# Patient Record
Sex: Male | Born: 1985 | Race: White | Hispanic: No | Marital: Single | Smoking: Never smoker
Health system: Southern US, Community
[De-identification: ages and names within clinical notes are randomized; demographics above are authoritative.]

## PROBLEM LIST (undated history)

## (undated) DIAGNOSIS — K219 Gastro-esophageal reflux disease without esophagitis: Secondary | ICD-10-CM

## (undated) HISTORY — PX: SURGERY SCROTAL / TESTICULAR: SUR1316

## (undated) HISTORY — DX: Gastro-esophageal reflux disease without esophagitis: K21.9

## (undated) HISTORY — PX: CYSTECTOMY: SUR359

---

## 2010-06-08 ENCOUNTER — Emergency Department (HOSPITAL_COMMUNITY): Admission: EM | Admit: 2010-06-08 | Discharge: 2010-06-08 | Payer: Self-pay | Admitting: Emergency Medicine

## 2011-11-25 ENCOUNTER — Emergency Department (HOSPITAL_COMMUNITY): Payer: Medicare Other

## 2011-11-25 ENCOUNTER — Emergency Department (HOSPITAL_COMMUNITY)
Admission: EM | Admit: 2011-11-25 | Discharge: 2011-11-25 | Disposition: A | Discharge status: Home / Self Care | Payer: Medicare Other | Attending: Emergency Medicine | Admitting: Emergency Medicine

## 2011-11-25 DIAGNOSIS — R10813 Right lower quadrant abdominal tenderness: Secondary | ICD-10-CM | POA: Insufficient documentation

## 2011-11-25 DIAGNOSIS — R1031 Right lower quadrant pain: Secondary | ICD-10-CM | POA: Insufficient documentation

## 2011-11-25 LAB — DIFFERENTIAL
Basophils Absolute: 0 K/uL (ref 0.0–0.1)
Basophils Relative: 0 % (ref 0–1)
Eosinophils Absolute: 0.1 K/uL (ref 0.0–0.7)
Eosinophils Relative: 1 % (ref 0–5)
Lymphocytes Relative: 13 % (ref 12–46)
Lymphs Abs: 1.3 K/uL (ref 0.7–4.0)
Monocytes Absolute: 0.7 K/uL (ref 0.1–1.0)
Monocytes Relative: 7 % (ref 3–12)
Neutro Abs: 7.9 K/uL — ABNORMAL HIGH (ref 1.7–7.7)
Neutrophils Relative %: 78 % — ABNORMAL HIGH (ref 43–77)

## 2011-11-25 LAB — COMPREHENSIVE METABOLIC PANEL WITH GFR
ALT: 12 U/L (ref 0–53)
AST: 16 U/L (ref 0–37)
Albumin: 4.6 g/dL (ref 3.5–5.2)
Alkaline Phosphatase: 81 U/L (ref 39–117)
BUN: 11 mg/dL (ref 6–23)
CO2: 31 meq/L (ref 19–32)
Calcium: 9.8 mg/dL (ref 8.4–10.5)
Chloride: 104 meq/L (ref 96–112)
Creatinine, Ser: 0.92 mg/dL (ref 0.50–1.35)
GFR calc Af Amer: >90 mL/min
GFR calc non Af Amer: >90 mL/min
Glucose, Bld: 104 mg/dL — ABNORMAL HIGH (ref 70–99)
Potassium: 3.9 meq/L (ref 3.5–5.1)
Sodium: 142 meq/L (ref 135–145)
Total Bilirubin: 1.4 mg/dL — ABNORMAL HIGH (ref 0.3–1.2)
Total Protein: 8.1 g/dL (ref 6.0–8.3)

## 2011-11-25 LAB — URINALYSIS, ROUTINE W REFLEX MICROSCOPIC
Bilirubin Urine: NEGATIVE
Glucose, UA: NEGATIVE mg/dL
Hgb urine dipstick: NEGATIVE
Ketones, ur: NEGATIVE mg/dL
Leukocytes, UA: NEGATIVE
Nitrite: NEGATIVE
Protein, ur: NEGATIVE mg/dL
Specific Gravity, Urine: 1.025 (ref 1.005–1.030)
Urobilinogen, UA: 0.2 mg/dL (ref 0.0–1.0)
pH: 6.5 (ref 5.0–8.0)

## 2011-11-25 LAB — CBC
HCT: 51.5 % (ref 39.0–52.0)
MCHC: 33.6 g/dL (ref 30.0–36.0)
Platelets: 198 10*3/uL (ref 150–400)
RDW: 14.2 % (ref 11.5–15.5)

## 2011-11-25 MED ORDER — ONDANSETRON HCL 4 MG/2ML IJ SOLN
4.0000 mg | Freq: Once | INTRAMUSCULAR | Status: AC
  Administered 2011-11-25: 4 mg via INTRAVENOUS
  Filled 2011-11-25: qty 2

## 2011-11-25 MED ORDER — MORPHINE SULFATE 4 MG/ML IJ SOLN
4.0000 mg | Freq: Once | INTRAMUSCULAR | Status: AC
  Administered 2011-11-25: 4 mg via INTRAVENOUS
  Filled 2011-11-25: qty 1

## 2011-11-25 MED ORDER — IOHEXOL 300 MG/ML  SOLN
100.0000 mL | Freq: Once | INTRAMUSCULAR | Status: AC | PRN
  Administered 2011-11-25: 100 mL via INTRAVENOUS

## 2011-11-25 NOTE — ED Provider Notes (Signed)
History  Scribed for Ward Givens, MD, the patient was seen in [facility]18/[facility]18. The chart was scribed by Gilman Schmidt. The patients care was started at 11:34 AM.   CSN: [id number] Arrival date & time: 11/25/2011 11:08 AM   First MD Initiated Contact with Patient 11/25/11 1112      Chief Complaint  Patient presents with  . Abdominal Pain     HPI Clifford Rowe is a 25 y.o. male who presents to the Emergency Department complaining of non radiating RLQ abdominal pain onset two days. Pt was referred by PCP to ED in order to rule out appendicitis. Denies any n/v, dysuria, urinary frequency, back pain, diarrhea, or fever. There are no exacerbating or alleviating factors. Denies any other medical problems. Pt notes having un descended testicle repair at age 42. Pt has had loss in appetite today. There are no other associated symptoms and no other alleviating or aggravating factors.    History reviewed. No pertinent past medical history. Mentally slow  History reviewed. No pertinent past surgical history. 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 No pertinent family history.  History  Substance Use Topics  . Smoking status: Never Smoker   . Smokeless tobacco: Not on file  . Alcohol Use: No  Unemployed and on SSI    Review of Systems  Gastrointestinal: Positive for abdominal pain. Negative for nausea, vomiting and diarrhea.  Genitourinary: Negative for dysuria and difficulty urinating.  All other systems reviewed and are negative.    Allergies  Review of patient's allergies indicates no known allergies.  Home Medications   Current Outpatient Rx  Name Route Sig Dispense Refill  . OMEPRAZOLE 20 MG PO CPDR Oral Take 20 mg by mouth daily.        BP 138/84  Pulse 75  Temp(Src) 98.2 F (36.8 C) (Oral)  Resp 18  Ht 6\' 2"  (1.88 m)  Wt 171 lb (77.565 kg)  BMI 21.96 kg/m2  SpO2 100%  Vital signs normal  Physical Exam  Constitutional: He is oriented to person, place,  and time. He appears well-developed and well-nourished.  Non-toxic appearance. He does not have a sickly appearance.  HENT:  Head: Normocephalic and atraumatic.  Eyes: Conjunctivae, EOM and lids are normal. Pupils are equal, round, and reactive to light.  Neck: Trachea normal, normal range of motion and full passive range of motion without pain. Neck supple.  Cardiovascular: Regular rhythm and normal heart sounds.   Pulmonary/Chest: Effort normal and breath sounds normal. No respiratory distress.  Abdominal: Soft. Normal appearance. He exhibits no distension. There is tenderness in the right lower quadrant. There is no rebound and no CVA tenderness.       No pain to knee tapping No Psoasis Sign   Musculoskeletal: Normal range of motion.  Neurological: He is alert and oriented to person, place, and time. He has normal strength.  Skin: Skin is warm, dry and intact. No rash noted.    ED Course  Procedures (including critical care time)  DIAGNOSTIC STUDIES: Oxygen Saturation is 100% on room air, normal by my interpretation.    COORDINATION OF CARE: 11:34am:  - Patient evaluated by ED physician, Morphine, Zofran, CT Abdomen, CBC, Diff, CMP, UA ordered 1:54pm: Recheck by EDP. Patient reports improved symptoms. Results Reviewed with patient. Plan for discharge  LABS Results for orders placed during the hospital encounter of 11/25/11  CBC      Component Value Range   WBC 10.1  4.0 - 10.5 (K/uL)  RBC 5.78  4.22 - 5.81 (MIL/uL)   Hemoglobin 17.3 (*) 13.0 - 17.0 (g/dL)   HCT 16.1  09.6 - 04.5 (%)   MCV 89.1  78.0 - 100.0 (fL)   MCH 29.9  26.0 - 34.0 (pg)   MCHC 33.6  30.0 - 36.0 (g/dL)   RDW 40.9  81.1 - 91.4 (%)   Platelets 198  150 - 400 (K/uL)  DIFFERENTIAL      Component Value Range   Neutrophils Relative 78 (*) 43 - 77 (%)   Neutro Abs 7.9 (*) 1.7 - 7.7 (K/uL)   Lymphocytes Relative 13  12 - 46 (%)   Lymphs Abs 1.3  0.7 - 4.0 (K/uL)   Monocytes Relative 7  3 - 12 (%)    Monocytes Absolute 0.7  0.1 - 1.0 (K/uL)   Eosinophils Relative 1  0 - 5 (%)   Eosinophils Absolute 0.1  0.0 - 0.7 (K/uL)   Basophils Relative 0  0 - 1 (%)   Basophils Absolute 0.0  0.0 - 0.1 (K/uL)  COMPREHENSIVE METABOLIC PANEL      Component Value Range   Sodium 142  135 - 145 (mEq/L)   Potassium 3.9  3.5 - 5.1 (mEq/L)   Chloride 104  96 - 112 (mEq/L)   CO2 31  19 - 32 (mEq/L)   Glucose, Bld 104 (*) 70 - 99 (mg/dL)   BUN 11  6 - 23 (mg/dL)   Creatinine, Ser 7.82  0.50 - 1.35 (mg/dL)   Calcium 9.8  8.4 - 95.6 (mg/dL)   Total Protein 8.1  6.0 - 8.3 (g/dL)   Albumin 4.6  3.5 - 5.2 (g/dL)   AST 16  0 - 37 (U/L)   ALT 12  0 - 53 (U/L)   Alkaline Phosphatase 81  39 - 117 (U/L)   Total Bilirubin 1.4 (*) 0.3 - 1.2 (mg/dL)   GFR calc non Af Amer >90  >90 (mL/min)   GFR calc Af Amer >90  >90 (mL/min)  URINALYSIS, ROUTINE W REFLEX MICROSCOPIC      Component Value Range   Color, Urine YELLOW  YELLOW    APPearance CLEAR  CLEAR    Specific Gravity, Urine 1.025  1.005 - 1.030    pH 6.5  5.0 - 8.0    Glucose, UA NEGATIVE  NEGATIVE (mg/dL)   Hgb urine dipstick NEGATIVE  NEGATIVE    Bilirubin Urine NEGATIVE  NEGATIVE    Ketones, ur NEGATIVE  NEGATIVE (mg/dL)   Protein, ur NEGATIVE  NEGATIVE (mg/dL)   Urobilinogen, UA 0.2  0.0 - 1.0 (mg/dL)   Nitrite NEGATIVE  NEGATIVE    Leukocytes, UA NEGATIVE  NEGATIVE    Vital signs no acute changes  Ct Abdomen Pelvis W Contrast  11/25/2011  *RADIOLOGY REPORT*  Clinical Data: Right lower quadrant pain  CT ABDOMEN AND PELVIS WITH CONTRAST  Technique:  Multidetector CT imaging of the abdomen and pelvis was performed following the standard protocol during bolus administration of intravenous contrast.  Contrast: OMNIPAQUE IOHEXOL 300 MG/ML IV SOLN  Comparison: July 29, 2007  Findings: The lung bases are clear.  The osseous structures are unremarkable.  The liver, gallbladder, spleen, pancreas, adrenal glands, kidneys, urinary bladder have a normal  appearance.  The bowel is unremarkable with no evidence of gross inflammation or obstruction.  The appendix is not seen but there are no secondary signs of appendicitis.  There is no adenopathy or free fluid within the abdomen or pelvis.  No  pneumoperitoneum.  IMPRESSION: Negative CT scan of the abdomen pelvis.  Original Report Authenticated By: Brandon Melnick, M.D.   Diagnoses that have been ruled out:  Diagnoses that are still under consideration:  Final diagnoses:  Abdominal pain    Plan discharge  MDM     I personally performed the services described in this documentation, which was scribed in my presence. The recorded information has been reviewed and considered. Devoria Albe, MD, Armando Gang       Ward Givens, MD 11/25/11 [phone]

## 2011-11-25 NOTE — ED Notes (Signed)
Dr. Modesto Charon from [facility] called this am and reports sent pt here for eval of RLQ pain.  Reports he did a dip stick urine in the office and reports was normal.  Reports pt c/o pain starting yesterday, no fever.

## 2011-11-25 NOTE — ED Notes (Signed)
Pt presents with RLQ pain x 2 days. Pt denies all other symptoms in triage. Pt sent from PMD.

## 2011-12-28 DIAGNOSIS — R5383 Other fatigue: Secondary | ICD-10-CM | POA: Diagnosis not present

## 2011-12-28 DIAGNOSIS — E559 Vitamin D deficiency, unspecified: Secondary | ICD-10-CM | POA: Diagnosis not present

## 2011-12-28 DIAGNOSIS — R5381 Other malaise: Secondary | ICD-10-CM | POA: Diagnosis not present

## 2012-01-14 DIAGNOSIS — I861 Scrotal varices: Secondary | ICD-10-CM | POA: Diagnosis not present

## 2012-01-15 DIAGNOSIS — N509 Disorder of male genital organs, unspecified: Secondary | ICD-10-CM | POA: Diagnosis not present

## 2012-01-15 DIAGNOSIS — I861 Scrotal varices: Secondary | ICD-10-CM | POA: Diagnosis not present

## 2012-01-15 DIAGNOSIS — R109 Unspecified abdominal pain: Secondary | ICD-10-CM | POA: Diagnosis not present

## 2012-01-23 DIAGNOSIS — I861 Scrotal varices: Secondary | ICD-10-CM | POA: Insufficient documentation

## 2012-01-23 DIAGNOSIS — Z87438 Personal history of other diseases of male genital organs: Secondary | ICD-10-CM | POA: Insufficient documentation

## 2012-01-23 DIAGNOSIS — Z87898 Personal history of other specified conditions: Secondary | ICD-10-CM | POA: Diagnosis not present

## 2012-01-23 DIAGNOSIS — N509 Disorder of male genital organs, unspecified: Secondary | ICD-10-CM | POA: Diagnosis not present

## 2012-02-19 DIAGNOSIS — I861 Scrotal varices: Secondary | ICD-10-CM | POA: Diagnosis not present

## 2012-02-19 DIAGNOSIS — N508 Other specified disorders of male genital organs: Secondary | ICD-10-CM | POA: Diagnosis not present

## 2012-02-19 DIAGNOSIS — N432 Other hydrocele: Secondary | ICD-10-CM | POA: Diagnosis not present

## 2012-02-19 DIAGNOSIS — K219 Gastro-esophageal reflux disease without esophagitis: Secondary | ICD-10-CM | POA: Diagnosis not present

## 2012-04-26 DIAGNOSIS — J019 Acute sinusitis, unspecified: Secondary | ICD-10-CM | POA: Diagnosis not present

## 2012-05-02 DIAGNOSIS — J3489 Other specified disorders of nose and nasal sinuses: Secondary | ICD-10-CM | POA: Diagnosis not present

## 2012-05-02 DIAGNOSIS — R51 Headache: Secondary | ICD-10-CM | POA: Diagnosis not present

## 2012-05-02 DIAGNOSIS — J320 Chronic maxillary sinusitis: Secondary | ICD-10-CM | POA: Diagnosis not present

## 2012-05-17 DIAGNOSIS — I861 Scrotal varices: Secondary | ICD-10-CM | POA: Diagnosis not present

## 2012-05-17 DIAGNOSIS — N509 Disorder of male genital organs, unspecified: Secondary | ICD-10-CM | POA: Diagnosis not present

## 2012-05-17 DIAGNOSIS — Z87898 Personal history of other specified conditions: Secondary | ICD-10-CM | POA: Diagnosis not present

## 2012-05-28 DIAGNOSIS — J310 Chronic rhinitis: Secondary | ICD-10-CM | POA: Diagnosis not present

## 2012-05-28 DIAGNOSIS — J3489 Other specified disorders of nose and nasal sinuses: Secondary | ICD-10-CM | POA: Diagnosis not present

## 2012-05-28 DIAGNOSIS — J343 Hypertrophy of nasal turbinates: Secondary | ICD-10-CM | POA: Diagnosis not present

## 2012-05-28 DIAGNOSIS — J342 Deviated nasal septum: Secondary | ICD-10-CM | POA: Diagnosis not present

## 2012-06-20 DIAGNOSIS — J309 Allergic rhinitis, unspecified: Secondary | ICD-10-CM | POA: Diagnosis not present

## 2012-06-20 DIAGNOSIS — J342 Deviated nasal septum: Secondary | ICD-10-CM | POA: Diagnosis not present

## 2012-06-20 DIAGNOSIS — J310 Chronic rhinitis: Secondary | ICD-10-CM | POA: Diagnosis not present

## 2012-06-20 DIAGNOSIS — J3489 Other specified disorders of nose and nasal sinuses: Secondary | ICD-10-CM | POA: Diagnosis not present

## 2012-08-31 DIAGNOSIS — J309 Allergic rhinitis, unspecified: Secondary | ICD-10-CM | POA: Diagnosis not present

## 2012-08-31 DIAGNOSIS — R42 Dizziness and giddiness: Secondary | ICD-10-CM | POA: Diagnosis not present

## 2012-09-02 DIAGNOSIS — I10 Essential (primary) hypertension: Secondary | ICD-10-CM | POA: Diagnosis not present

## 2012-09-02 DIAGNOSIS — E785 Hyperlipidemia, unspecified: Secondary | ICD-10-CM | POA: Diagnosis not present

## 2012-09-02 DIAGNOSIS — R5383 Other fatigue: Secondary | ICD-10-CM | POA: Diagnosis not present

## 2012-10-04 ENCOUNTER — Ambulatory Visit (HOSPITAL_COMMUNITY)
Admission: RE | Admit: 2012-10-04 | Discharge: 2012-10-04 | Disposition: A | Discharge status: Home / Self Care | Payer: Medicare Other | Source: Ambulatory Visit | Attending: Family Medicine | Admitting: Family Medicine

## 2012-10-04 ENCOUNTER — Other Ambulatory Visit: Payer: Self-pay | Admitting: Family Medicine

## 2012-10-04 DIAGNOSIS — R109 Unspecified abdominal pain: Secondary | ICD-10-CM | POA: Diagnosis not present

## 2012-10-04 DIAGNOSIS — R1011 Right upper quadrant pain: Secondary | ICD-10-CM

## 2012-10-05 ENCOUNTER — Emergency Department (HOSPITAL_COMMUNITY)
Admission: EM | Admit: 2012-10-05 | Discharge: 2012-10-05 | Disposition: A | Discharge status: Home / Self Care | Payer: Medicare Other | Attending: Emergency Medicine | Admitting: Emergency Medicine

## 2012-10-05 ENCOUNTER — Encounter (HOSPITAL_COMMUNITY): Payer: Self-pay | Admitting: *Deleted

## 2012-10-05 DIAGNOSIS — K219 Gastro-esophageal reflux disease without esophagitis: Secondary | ICD-10-CM | POA: Insufficient documentation

## 2012-10-05 LAB — URINALYSIS, ROUTINE W REFLEX MICROSCOPIC
Glucose, UA: NEGATIVE mg/dL
Leukocytes, UA: NEGATIVE
pH: 7 (ref 5.0–8.0)

## 2012-10-05 LAB — COMPREHENSIVE METABOLIC PANEL
AST: 16 U/L (ref 0–37)
Alkaline Phosphatase: 72 U/L (ref 39–117)
BUN: 9 mg/dL (ref 6–23)
CO2: 29 mEq/L (ref 19–32)
Chloride: 99 mEq/L (ref 96–112)
Creatinine, Ser: 0.98 mg/dL (ref 0.50–1.35)
GFR calc non Af Amer: >90 mL/min (ref >90)
Total Bilirubin: 1.6 mg/dL — ABNORMAL HIGH (ref 0.3–1.2)

## 2012-10-05 LAB — CBC WITH DIFFERENTIAL/PLATELET
Basophils Absolute: 0 10*3/uL (ref 0.0–0.1)
HCT: 51.1 % (ref 39.0–52.0)
Hemoglobin: 17.6 g/dL — ABNORMAL HIGH (ref 13.0–17.0)
Lymphocytes Relative: 13 % (ref 12–46)
Monocytes Absolute: 0.5 10*3/uL (ref 0.1–1.0)
Monocytes Relative: 7 % (ref 3–12)
Neutro Abs: 6.7 10*3/uL (ref 1.7–7.7)
RBC: 5.86 MIL/uL — ABNORMAL HIGH (ref 4.22–5.81)
WBC: 8.4 10*3/uL (ref 4.0–10.5)

## 2012-10-05 MED ORDER — OXYCODONE-ACETAMINOPHEN 5-325 MG PO TABS
2.0000 | ORAL_TABLET | Freq: Once | ORAL | Status: AC
  Administered 2012-10-05: 2 via ORAL
  Filled 2012-10-05: qty 2

## 2012-10-05 MED ORDER — GI COCKTAIL ~~LOC~~
30.0000 mL | Freq: Once | ORAL | Status: AC
  Administered 2012-10-05: 30 mL via ORAL
  Filled 2012-10-05: qty 30

## 2012-10-05 MED ORDER — OXYCODONE-ACETAMINOPHEN 5-325 MG PO TABS: 1.0000 | ORAL_TABLET | Freq: Four times a day (QID) | ORAL | Status: DC | PRN

## 2012-10-05 MED ORDER — PANTOPRAZOLE SODIUM 40 MG PO TBEC: 40.0000 mg | DELAYED_RELEASE_TABLET | Freq: Every day | ORAL | Status: DC

## 2012-10-05 NOTE — ED Provider Notes (Signed)
History  This chart was scribed for Raeleigh Guinn B. Bernette Mayers, MD by Shari Heritage. The patient was seen in room APA11/APA11. Patient's care was started at 1234.     CSN: [id number]  Arrival date & time 10/05/12  1140   First MD Initiated Contact with Patient 10/05/12 1234      Chief Complaint  Patient presents with  . Abdominal Pain  . Nausea     The history is provided by the patient. No language interpreter was used.    Clifford Rowe is a 26 y.o. male who presents to the Emergency Department complaining of moderate to severe, constant, non-radiating, burning epigastric abdominal pain. Patient denies nausea, vomiting, diarrhea, bowel incontinence or any urinary symptoms. Patient rates pain as 8/10. Patient states that pain is worse at night and when he eats. Patient says that the pain does not travel up into his chest and throat. He states that pain is similar to acid reflux pain. He still has some omeprazole at home and states that he took 1 capsule this morning. Patient denies history of pancreatic or gallbladder disorders. He does not smoke.     History reviewed. No pertinent past medical history.  Past Surgical History  Procedure Date  . Cystectomy   . Surgery scrotal / testicular     History reviewed. No pertinent family history.  History  Substance Use Topics  . Smoking status: Never Smoker   . Smokeless tobacco: Not on file  . Alcohol Use: No      Review of Systems A complete 10 system review of systems was obtained and all systems are negative except as noted in the HPI and PMH.   Allergies  Review of patient's allergies indicates no known allergies.  Home Medications   Current Outpatient Rx  Name Route Sig Dispense Refill  . OMEPRAZOLE 20 MG PO CPDR Oral Take 20 mg by mouth daily.        BP 129/76  Pulse 97  Temp 98.2 F (36.8 C) (Oral)  Resp 18  Ht 6\' 2"  (1.88 m)  Wt 175 lb (79.379 kg)  BMI 22.47 kg/m2  SpO2 100%  Physical Exam  Nursing note and  vitals reviewed. Constitutional: He is oriented to person, place, and time. He appears well-developed and well-nourished.  HENT:  Head: Normocephalic and atraumatic.  Eyes: EOM are normal. Pupils are equal, round, and reactive to light.  Neck: Normal range of motion. Neck supple.  Cardiovascular: Normal rate, normal heart sounds and intact distal pulses.   Pulmonary/Chest: Effort normal and breath sounds normal.  Abdominal: Bowel sounds are normal. He exhibits no distension. There is no tenderness.       Nontender.  Musculoskeletal: Normal range of motion. He exhibits no edema and no tenderness.  Neurological: He is alert and oriented to person, place, and time. He has normal strength. No cranial nerve deficit or sensory deficit.  Skin: Skin is warm and dry. No rash noted.  Psychiatric: He has a normal mood and affect.    ED Course  Procedures (including critical care time) DIAGNOSTIC STUDIES: Oxygen Saturation is 100% on room air, normal by my interpretation.    COORDINATION OF CARE: 12:44pm- Patient informed of current plan for treatment and evaluation and agrees with plan at this time.    Labs Reviewed  CBC WITH DIFFERENTIAL - Abnormal; Notable for the following:    RBC 5.86 (*)     Hemoglobin 17.6 (*)     Neutrophils Relative 80 (*)  All other components within normal limits  COMPREHENSIVE METABOLIC PANEL - Abnormal; Notable for the following:    Total Bilirubin 1.6 (*)     All other components within normal limits  URINALYSIS, ROUTINE W REFLEX MICROSCOPIC  LIPASE, BLOOD   US Abdomen Limited Ruq  10/04/2012  *RADIOLOGY REPORT*  Clinical Data:  Right upper quadrant abdominal pain.  LIMITED ABDOMINAL ULTRASOUND - RIGHT UPPER QUADRANT  Comparison:  CT of the abdomen and pelvis 11/25/2011.  Findings:  Gallbladder:  No shadowing gallstones or echogenic sludge.  No gallbladder wall thickening or pericholecystic fluid.  Negative sonographic Murphy's sign according to the  ultrasound technologist.  Common bile duct:  Normal caliber measuring 3.2 mm in the porta hepatis.  Liver:  Normal size and echotexture without focal parenchymal abnormality.  Patent portal vein with hepatopetal flow.  IMPRESSION: Unremarkable right upper quadrant abdominal ultrasound. Specifically, no evidence of cholelithiasis or findings to suggest acute cholecystitis at this time.                    Original Report Authenticated By: Florencia Reasons, M.D.      No diagnosis found.    MDM  After ED workup complete, patient informed me of the [place] he had done yesterday (see above) after visit to PCP for same symptoms. Labs here normal. Symptoms related to GERD not well controlled on omeprazole. Will change to protonix, advised PCP followup for further eval.       I personally performed the services described in the documentation, which were scribed in my presence. The recorded information has been reviewed and considered.     Bob Eastwood B. Bernette Mayers, MD 10/05/12 1351

## 2012-10-05 NOTE — ED Notes (Signed)
Pt with abd pain and nausea x 5 days, denies vomiting or diarrhea

## 2012-11-11 DIAGNOSIS — R7989 Other specified abnormal findings of blood chemistry: Secondary | ICD-10-CM | POA: Diagnosis not present

## 2013-03-15 ENCOUNTER — Ambulatory Visit (INDEPENDENT_AMBULATORY_CARE_PROVIDER_SITE_OTHER): Payer: Medicare Other | Admitting: Family Medicine

## 2013-03-15 VITALS — BP 132/69 | HR 82 | Temp 97.1°F | Ht 75.0 in | Wt 170.1 lb

## 2013-03-15 DIAGNOSIS — J069 Acute upper respiratory infection, unspecified: Secondary | ICD-10-CM | POA: Diagnosis not present

## 2013-03-15 DIAGNOSIS — J019 Acute sinusitis, unspecified: Secondary | ICD-10-CM | POA: Diagnosis not present

## 2013-03-15 DIAGNOSIS — J309 Allergic rhinitis, unspecified: Secondary | ICD-10-CM | POA: Diagnosis not present

## 2013-03-15 MED ORDER — AMOXICILLIN 875 MG PO TABS: 875.0000 mg | ORAL_TABLET | Freq: Two times a day (BID) | ORAL | Status: DC

## 2013-03-15 NOTE — Progress Notes (Signed)
Subjective:     Patient ID: Clifford Rowe, male   DOB: 12-12-1986, 27 y.o.   MRN: [id number]  HPI Patient comes in with a five-day history of a severe upper respiratory infection. He is so congested he has to mouth breathe. He uses Flonase nasal spray. Start to get pain under his eyes specifically in the cheeks and is blowing yellow-green stuff from his nose. No shortness of breath no cough and no chest pain.  Past Medical History  Diagnosis Date  . GERD (gastroesophageal reflux disease)    Past Surgical History  Procedure Laterality Date  . Cystectomy    . Surgery scrotal / testicular     History   Social History  . Marital Status: Single    Spouse Name: N/A    Number of Children: N/A  . Years of Education: N/A   Occupational History  . unemployed    Social History Main Topics  . Smoking status: Never Smoker   . Smokeless tobacco: Not on file  . Alcohol Use: No  . Drug Use: No  . Sexually Active: Not on file   Other Topics Concern  . Not on file   Social History Narrative  . No narrative on file   Family History  Problem Relation Age of Onset  . Cancer Father     lung   Current Outpatient Prescriptions on File Prior to Visit  Medication Sig Dispense Refill  . pantoprazole (PROTONIX) 40 MG tablet Take 1 tablet (40 mg total) by mouth daily.  30 tablet  0  . aspirin EC 81 MG tablet Take 162 mg by mouth daily as needed. For pain      . oxyCODONE-acetaminophen (PERCOCET/ROXICET) 5-325 MG per tablet Take 1-2 tablets by mouth every 6 (six) hours as needed for pain.  20 tablet  0  . [DISCONTINUED] omeprazole (PRILOSEC) 20 MG capsule Take 20 mg by mouth daily.        No current facility-administered medications on file prior to visit.   No Known Allergies  There is no immunization history on file for this patient. Prior to Admission medications   Medication Sig Start Date End Date Taking? Authorizing Provider  pantoprazole (PROTONIX) 40 MG tablet Take 1 tablet (40 mg  total) by mouth daily. 10/05/12  Yes Charles B. Bernette Mayers, MD  amoxicillin (AMOXIL) 875 MG tablet Take 1 tablet (875 mg total) by mouth 2 (two) times daily. 03/15/13   Ileana Ladd, MD  aspirin EC 81 MG tablet Take 162 mg by mouth daily as needed. For pain    Historical Provider, MD  oxyCODONE-acetaminophen (PERCOCET/ROXICET) 5-325 MG per tablet Take 1-2 tablets by mouth every 6 (six) hours as needed for pain. 10/05/12   Charles B. Bernette Mayers, MD      Review of Systems No other specific complaints.    Objective:   Physical Exam On examination he appeared in no acute distress. Very congested nasally and mouth breathing Vital signs as documented.  Skin warm and dry and without overt rashes.  Head &Neck without JVD. Normal. Coryza. Nasal mucosa is swollen boggy and the left nostril is not patent due to the swelling. Maxillary sinus area mildly tender to palpation. Lungs clear.  Heart exam notable for regular rhythm, normal sounds and absence of murmurs, rubs or gallops.  Abdomen unremarkable and without evidence of organomegaly, masses, or abdominal aortic enlargement.  Extremities nonedematous. Neurology normal Assessment:        Acute sinusitis - Plan: amoxicillin (AMOXIL) 875 MG  tablet  Allergic rhinitis  URI (upper respiratory infection)   Plan:     Continue to use his Flonase daily. Increase fluids. Prescribed from an antibiotic as per med orders in Epic return to the clinic when necessary.  Selvin Yun P. Modesto Charon, M.D.

## 2013-04-09 ENCOUNTER — Telehealth: Payer: Self-pay | Admitting: Nurse Practitioner

## 2013-04-09 NOTE — Telephone Encounter (Signed)
Pt aware no available appts and he will call back to reschedule

## 2013-04-19 ENCOUNTER — Other Ambulatory Visit: Payer: Self-pay | Admitting: Physician Assistant

## 2013-04-21 NOTE — Telephone Encounter (Signed)
Okay to refill x3

## 2013-04-21 NOTE — Telephone Encounter (Signed)
Called RX to [facility].

## 2013-04-21 NOTE — Telephone Encounter (Signed)
LAST OV 10/13 

## 2013-05-16 DIAGNOSIS — I861 Scrotal varices: Secondary | ICD-10-CM | POA: Diagnosis not present

## 2013-05-16 DIAGNOSIS — N509 Disorder of male genital organs, unspecified: Secondary | ICD-10-CM | POA: Diagnosis not present

## 2013-05-28 ENCOUNTER — Encounter: Payer: Self-pay | Admitting: Physician Assistant

## 2013-05-28 ENCOUNTER — Ambulatory Visit (INDEPENDENT_AMBULATORY_CARE_PROVIDER_SITE_OTHER): Payer: Medicare Other | Admitting: Physician Assistant

## 2013-05-28 VITALS — BP 125/74 | HR 66 | Temp 97.7°F | Ht 75.0 in | Wt 164.0 lb

## 2013-05-28 DIAGNOSIS — K219 Gastro-esophageal reflux disease without esophagitis: Secondary | ICD-10-CM | POA: Diagnosis not present

## 2013-05-28 DIAGNOSIS — M549 Dorsalgia, unspecified: Secondary | ICD-10-CM

## 2013-05-28 MED ORDER — MELOXICAM 15 MG PO TABS: 15.0000 mg | ORAL_TABLET | Freq: Every day | ORAL | Status: DC

## 2013-05-28 NOTE — Patient Instructions (Signed)
Back Pain, Adult  Low back pain is very common. About 1 in 5 people have back pain. The cause of low back pain is rarely dangerous. The pain often gets better over time. About half of people with a sudden onset of back pain feel better in just 2 weeks. About 8 in 10 people feel better by 6 weeks.   CAUSES  Some common causes of back pain include:  · Strain of the muscles or ligaments supporting the spine.  · Wear and tear (degeneration) of the spinal discs.  · Arthritis.  · Direct injury to the back.  DIAGNOSIS  Most of the time, the direct cause of low back pain is not known. However, back pain can be treated effectively even when the exact cause of the pain is unknown. Answering your caregiver's questions about your overall health and symptoms is one of the most accurate ways to make sure the cause of your pain is not dangerous. If your caregiver needs more information, he or she may order lab work or imaging tests (X-rays or MRIs). However, even if imaging tests show changes in your back, this usually does not require surgery.  HOME CARE INSTRUCTIONS  For many people, back pain returns. Since low back pain is rarely dangerous, it is often a condition that people can learn to manage on their own.   · Remain active. It is stressful on the back to sit or stand in one place. Do not sit, drive, or stand in one place for more than 30 minutes at a time. Take short walks on level surfaces as soon as pain allows. Try to increase the length of time you walk each day.  · Do not stay in bed. Resting more than 1 or 2 days can delay your recovery.  · Do not avoid exercise or work. Your body is made to move. It is not dangerous to be active, even though your back may hurt. Your back will likely heal faster if you return to being active before your pain is gone.  · Pay attention to your body when you  bend and lift. Many people have less discomfort when lifting if they bend their knees, keep the load close to their bodies, and  avoid twisting. Often, the most comfortable positions are those that put less stress on your recovering back.  · Find a comfortable position to sleep. Use a firm mattress and lie on your side with your knees slightly bent. If you lie on your back, put a pillow under your knees.  · Only take over-the-counter or prescription medicines as directed by your caregiver. Over-the-counter medicines to reduce pain and inflammation are often the most helpful. Your caregiver may prescribe muscle relaxant drugs. These medicines help dull your pain so you can more quickly return to your normal activities and healthy exercise.  · Put ice on the injured area.  · Put ice in a plastic bag.  · Place a towel between your skin and the bag.  · Leave the ice on for 15-20 minutes, 3-4 times a day for the first 2 to 3 days. After that, ice and heat may be alternated to reduce pain and spasms.  · Ask your caregiver about trying back exercises and gentle massage. This may be of some benefit.  · Avoid feeling anxious or stressed. Stress increases muscle tension and can worsen back pain. It is important to recognize when you are anxious or stressed and learn ways to manage it. Exercise is a great option.  SEEK MEDICAL CARE IF:  · You have pain that is not relieved with rest or   medicine.  · You have pain that does not improve in 1 week.  · You have new symptoms.  · You are generally not feeling well.  SEEK IMMEDIATE MEDICAL CARE IF:   · You have pain that radiates from your back into your legs.  · You develop new bowel or bladder control problems.  · You have unusual weakness or numbness in your arms or legs.  · You develop nausea or vomiting.  · You develop abdominal pain.  · You feel faint.  Document Released: 12/11/2005 Document Revised: 06/11/2012 Document Reviewed: 05/01/2011  [employer]® Patient Information ©2014 [employer].

## 2013-05-28 NOTE — Progress Notes (Signed)
Subjective:     Patient ID: Clifford Rowe, male   DOB: [birth year], 27 y.o.   MRN: [id number]  HPI Pt with lower back pain for 1 day Sx worse with laying for period of time He does not remember an injury but did a lot of yard work 2 days prior No radiation of sx He took several ASA last pm with no change in sx No change in bowel/bladder function   Review of Systems  All other systems reviewed and are negative.       Objective:   Physical Exam  Nursing note and vitals reviewed.  NAD- sitting comfortably No ecchy/edema to L-spine area No TTP FROM L-spine SLR neg DTR 1+/= lower ext Muscle strength distal good Pulses/sensory good    Assessment:     1. Backache   2. GERD (gastroesophageal reflux disease)        Plan:     Mobic 15mg  1 po qd  Heat/Ice Massage Gentle stretching F/U prn

## 2013-08-14 ENCOUNTER — Other Ambulatory Visit: Payer: Self-pay | Admitting: Family Medicine

## 2013-08-30 DIAGNOSIS — S61509A Unspecified open wound of unspecified wrist, initial encounter: Secondary | ICD-10-CM | POA: Diagnosis not present

## 2013-08-30 DIAGNOSIS — M25539 Pain in unspecified wrist: Secondary | ICD-10-CM | POA: Diagnosis not present

## 2013-08-30 DIAGNOSIS — M79609 Pain in unspecified limb: Secondary | ICD-10-CM | POA: Diagnosis not present

## 2013-08-30 DIAGNOSIS — Z79899 Other long term (current) drug therapy: Secondary | ICD-10-CM | POA: Diagnosis not present

## 2013-09-26 IMAGING — US US ABDOMEN LIMITED
1 series · 14 of 25 positions shown · non-contrast
Comparison: CT of the abdomen and pelvis 11/25/2011.

CLINICAL DATA: Right upper quadrant abdominal pain.

LIMITED ABDOMINAL ULTRASOUND - RIGHT UPPER QUADRANT

[Series 1: us abdomen limited · 0.26mm/px · 14 of 51 slices shown]
[im 1/51]
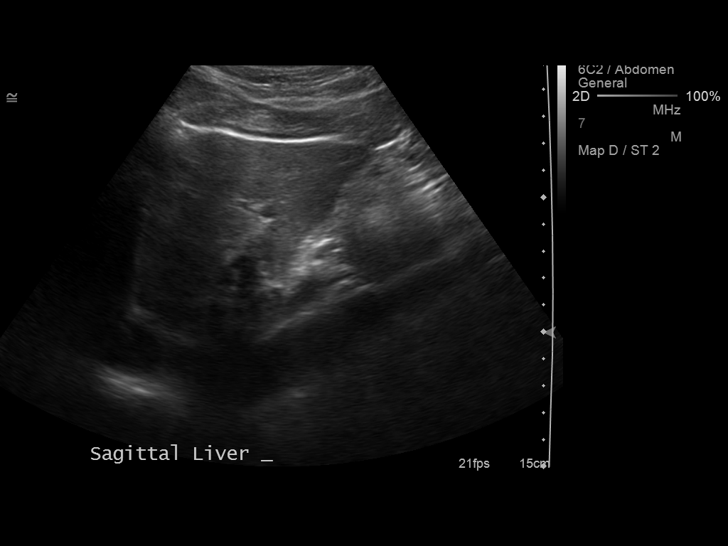
[im 5/51]
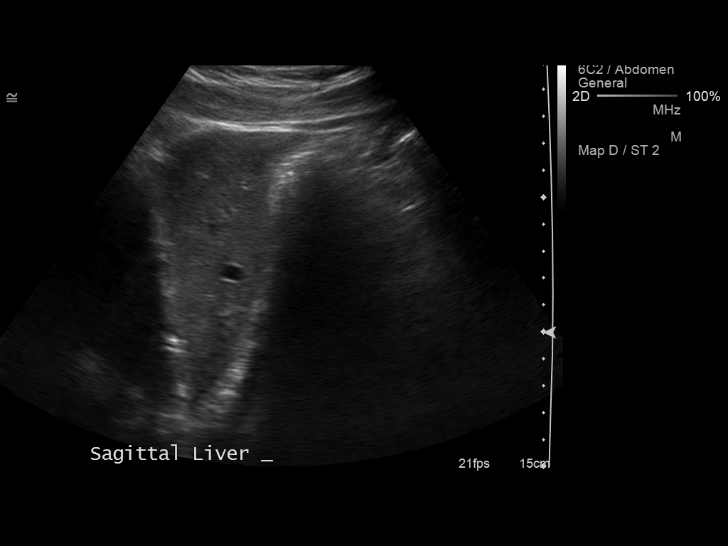
[im 9/51]
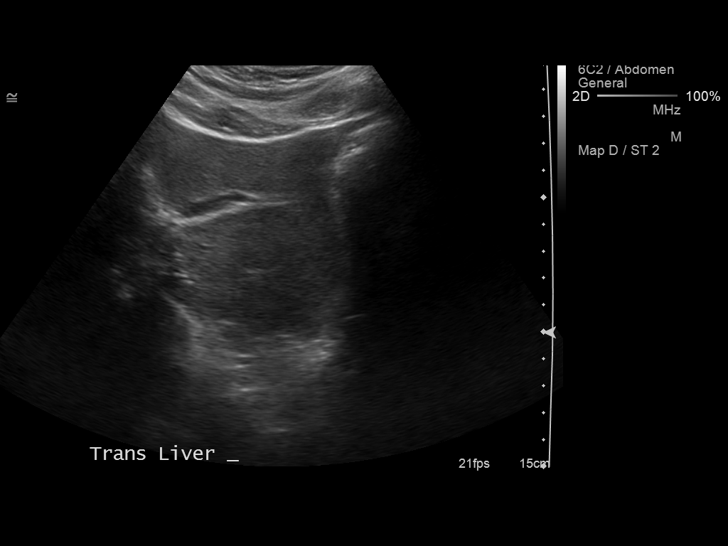
[im 13/51]
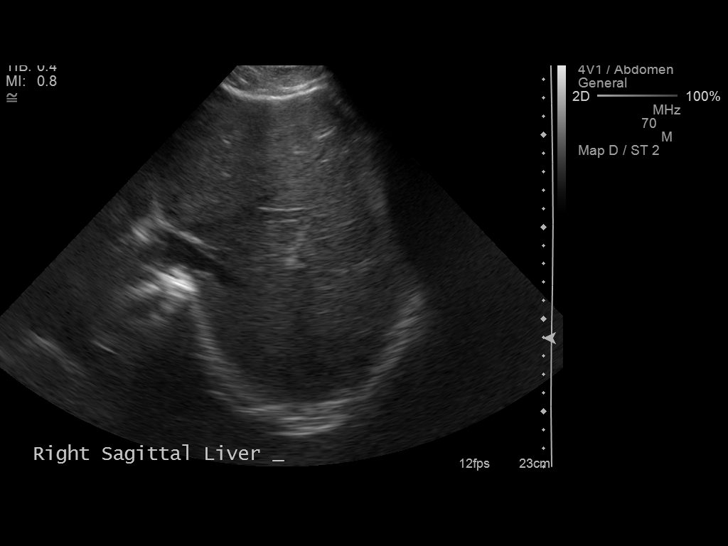
[im 17/51]
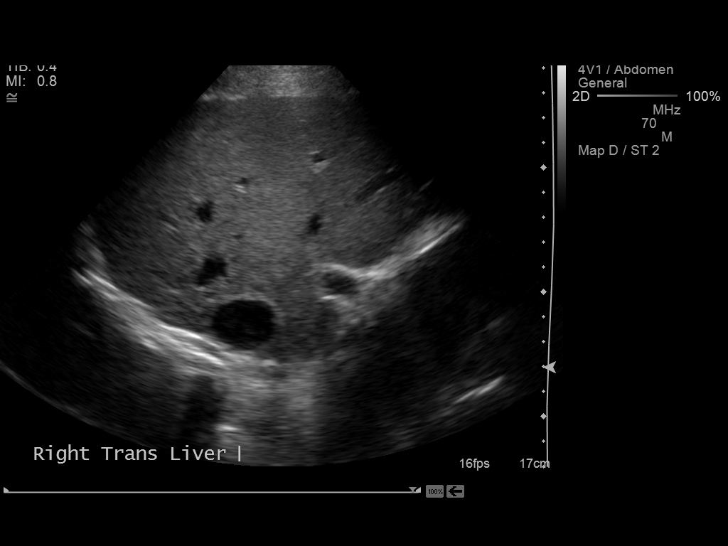
[im 19/51]
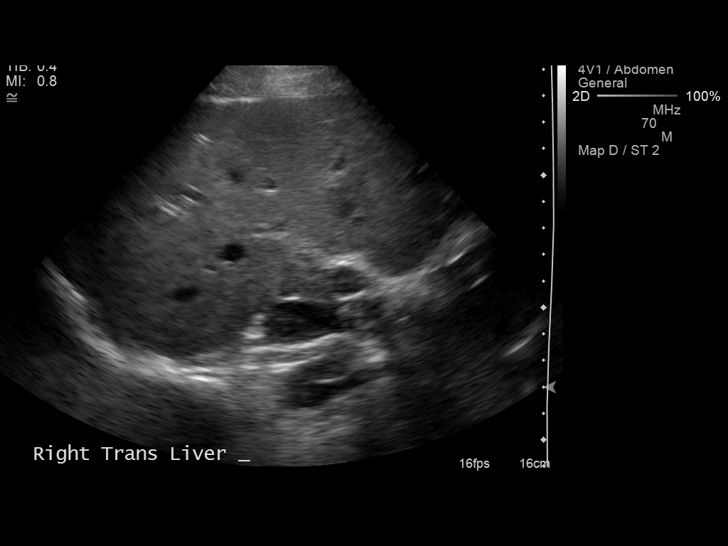
[im 23/51]
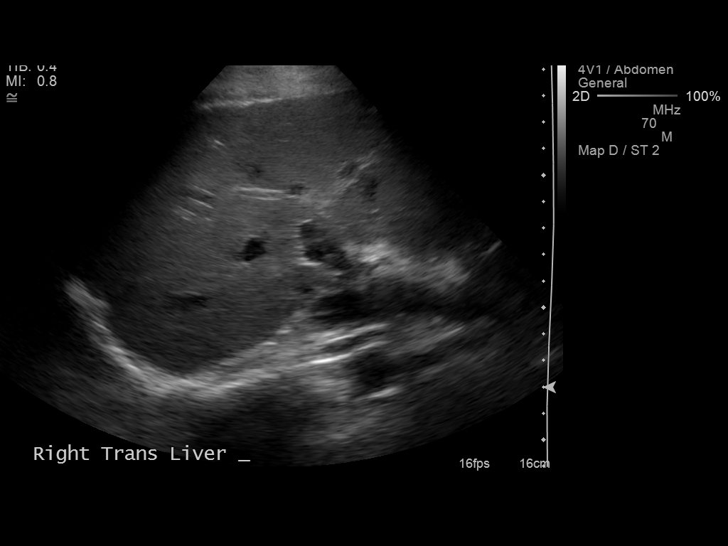
[im 28/51]
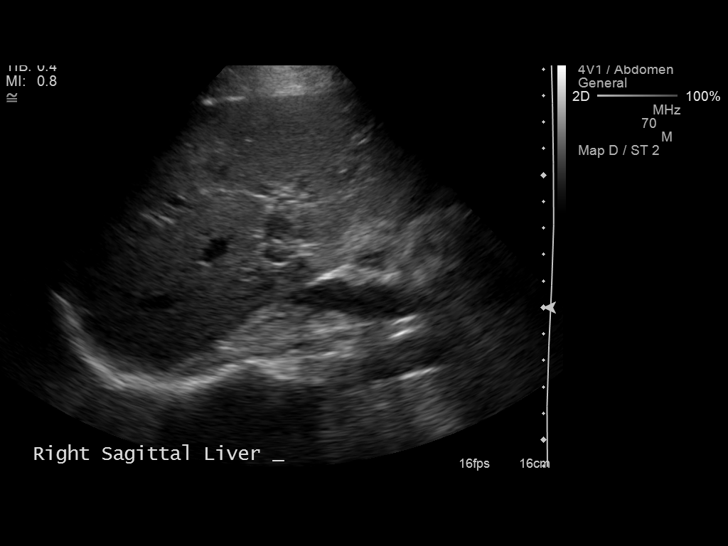
[im 32/51]
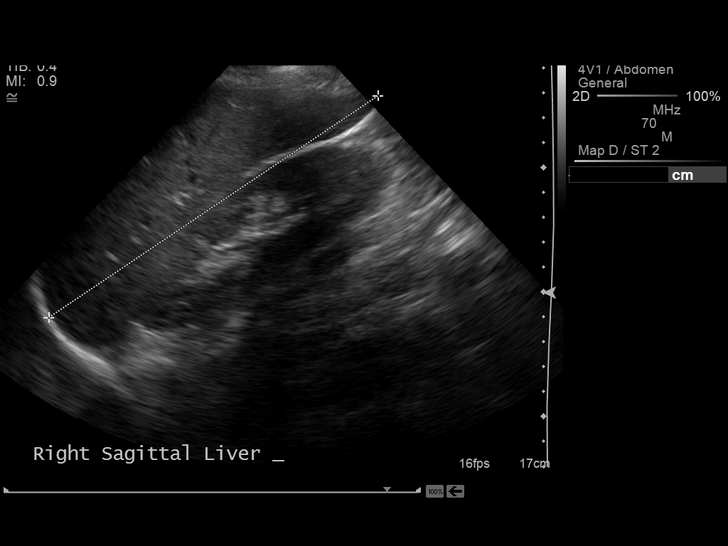
[im 34/51]
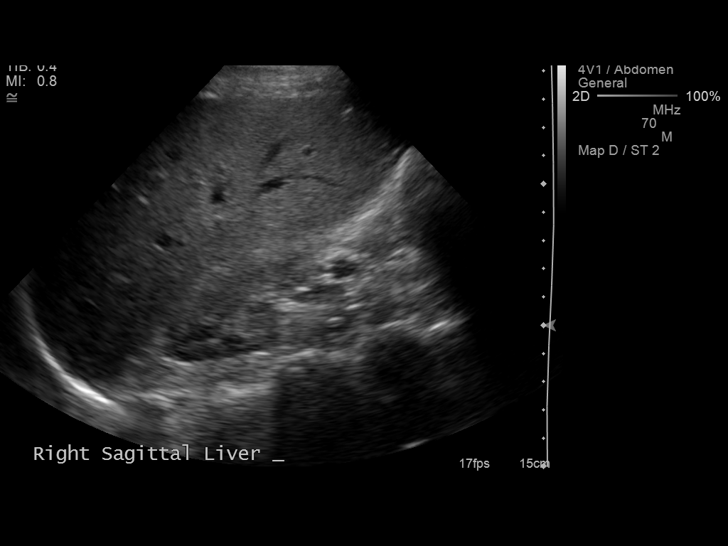
[im 38/51]
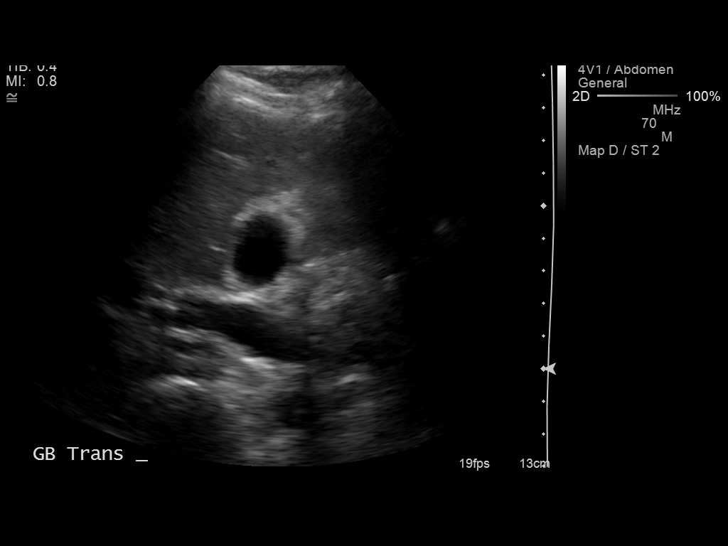
[im 42/51]
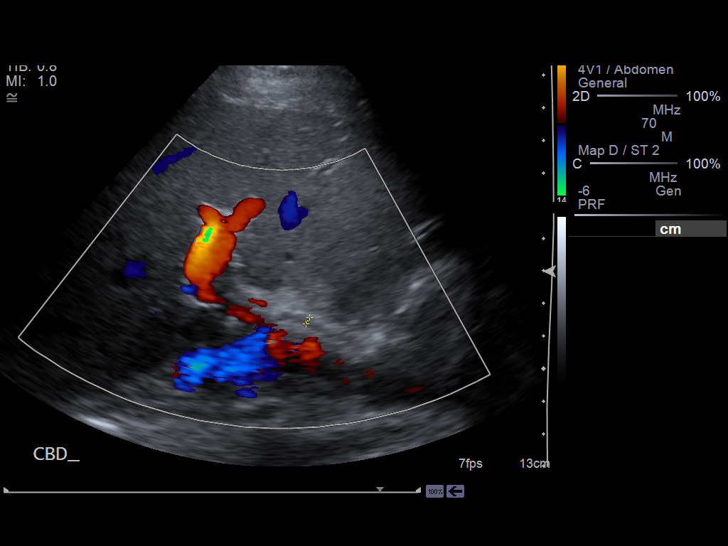
[im 46/51]
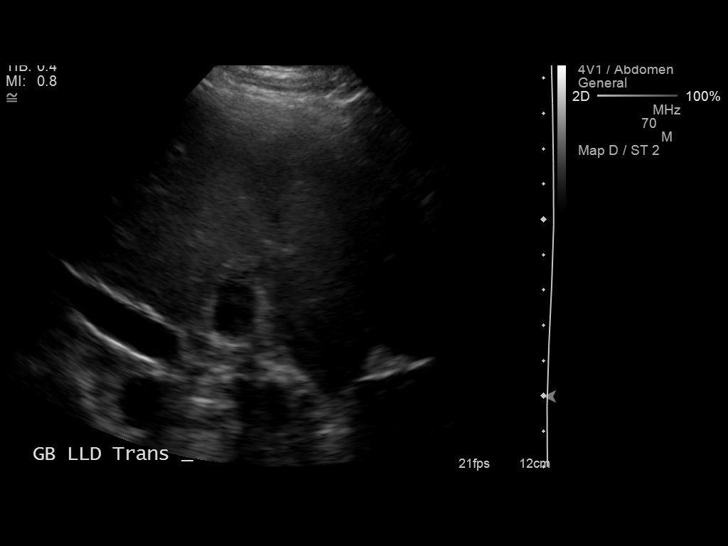
[im 51/51]
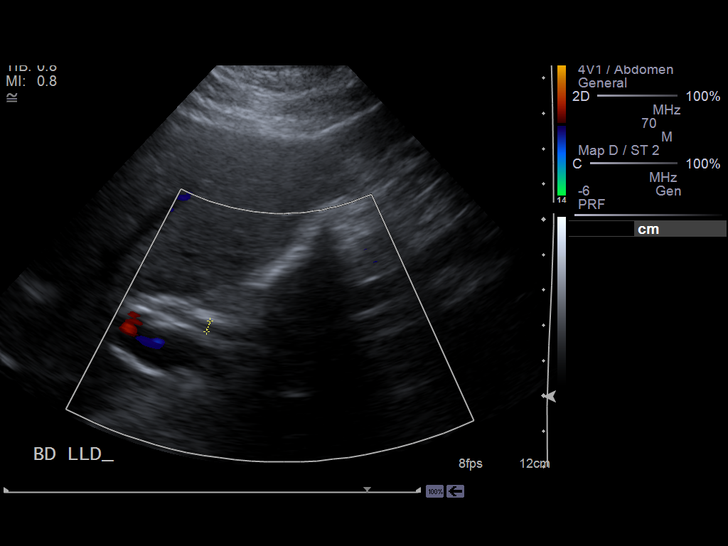

[14 of 25 positions shown; findings below may reference images not displayed]

FINDINGS: Gallbladder:  No shadowing gallstones or echogenic sludge.  No
gallbladder wall thickening or pericholecystic fluid.  Negative
sonographic Murphy's sign according to the ultrasound technologist.

Common bile duct:  Normal caliber measuring 3.2 mm in the porta
hepatis.

Liver:  Normal size and echotexture without focal parenchymal
abnormality.  Patent portal vein with hepatopetal flow.
IMPRESSION: Unremarkable right upper quadrant abdominal ultrasound.
Specifically, no evidence of cholelithiasis or findings to suggest
acute cholecystitis at this time.

## 2013-10-22 ENCOUNTER — Ambulatory Visit (INDEPENDENT_AMBULATORY_CARE_PROVIDER_SITE_OTHER): Payer: Medicare Other | Admitting: Family Medicine

## 2013-10-22 VITALS — BP 120/68 | HR 65 | Temp 97.2°F | Ht 75.0 in | Wt 170.0 lb

## 2013-10-22 DIAGNOSIS — R21 Rash and other nonspecific skin eruption: Secondary | ICD-10-CM

## 2013-10-22 DIAGNOSIS — M940 Chondrocostal junction syndrome [Tietze]: Secondary | ICD-10-CM | POA: Diagnosis not present

## 2013-10-22 MED ORDER — NAPROXEN 500 MG PO TABS: 500.0000 mg | ORAL_TABLET | Freq: Two times a day (BID) | ORAL | Status: DC

## 2013-10-22 MED ORDER — NYSTATIN-TRIAMCINOLONE 100000-0.1 UNIT/GM-% EX OINT: TOPICAL_OINTMENT | Freq: Two times a day (BID) | CUTANEOUS | Status: DC

## 2013-10-22 NOTE — Patient Instructions (Signed)

## 2013-10-22 NOTE — Progress Notes (Signed)
  Subjective:    Patient ID: Jonell Krontz, male    DOB: [birth year], 27 y.o.   MRN: [id number]  HPI This 27 y.o. male presents for evaluation of pulled muscle in chest for 2 weeks. He c/o rash on his chest.  He has had this for over a few weeks.  Review of Systems    No chest pain, SOB, HA, dizziness, vision change, N/V, diarrhea, constipation, dysuria, urinary urgency or frequency, myalgias, arthralgias or rash.  Objective:   Physical Exam Vital signs noted  Well developed well nourished male.  HEENT - Head atraumatic Normocephalic                Eyes - PERRLA, Conjuctiva - clear Sclera- Clear EOMI                Ears - EAC's Wnl TM's Wnl Gross Hearing WNL                Nose - Nares patent                 Throat - oropharanx wnl Respiratory - Lungs CTA bilateral Cardiac - RRR S1 and S2 without murmur. Skin - Rash over anterior chest. MS - TTP sternum and anterior costal area.      Assessment & Plan:  Rash and nonspecific skin eruption - Plan: nystatin-triamcinolone ointment (MYCOLOG)  Costal chondritis - Plan: naproxen (NAPROSYN) 500 MG tablet po bid x 10 days  Deatra Canter FNP

## 2013-12-13 ENCOUNTER — Other Ambulatory Visit: Payer: Self-pay | Admitting: Family Medicine

## 2013-12-19 ENCOUNTER — Other Ambulatory Visit: Payer: Self-pay | Admitting: Family Medicine

## 2013-12-29 ENCOUNTER — Ambulatory Visit (INDEPENDENT_AMBULATORY_CARE_PROVIDER_SITE_OTHER): Payer: Medicare Other | Admitting: Family Medicine

## 2013-12-29 ENCOUNTER — Ambulatory Visit (INDEPENDENT_AMBULATORY_CARE_PROVIDER_SITE_OTHER): Payer: Medicare Other

## 2013-12-29 VITALS — BP 123/72 | HR 70 | Temp 96.7°F | Ht 75.0 in | Wt 171.0 lb

## 2013-12-29 DIAGNOSIS — R079 Chest pain, unspecified: Secondary | ICD-10-CM | POA: Diagnosis not present

## 2013-12-29 DIAGNOSIS — R1013 Epigastric pain: Secondary | ICD-10-CM

## 2013-12-29 DIAGNOSIS — K3189 Other diseases of stomach and duodenum: Secondary | ICD-10-CM | POA: Diagnosis not present

## 2013-12-29 LAB — POCT CBC
Granulocyte percent: 68.9 %G (ref 37–80)
HCT, POC: 53.1 % (ref 43.5–53.7)
Hemoglobin: 17.1 g/dL (ref 14.1–18.1)
Lymph, poc: 1.5 (ref 0.6–3.4)
MCH, POC: 28.1 pg (ref 27–31.2)
MCHC: 32.3 g/dL (ref 31.8–35.4)
MCV: 87 fL (ref 80–97)
MPV: 10.8 fL (ref 0–99.8)
POC Granulocyte: 4.1 (ref 2–6.9)
POC LYMPH PERCENT: 25.5 %L (ref 10–50)
Platelet Count, POC: 149 10*3/uL (ref 142–424)
RBC: 6.1 M/uL (ref 4.69–6.13)
RDW, POC: 13.9 %
WBC: 5.9 10*3/uL (ref 4.6–10.2)

## 2013-12-29 MED ORDER — PANTOPRAZOLE SODIUM 40 MG PO TBEC: 40.0000 mg | DELAYED_RELEASE_TABLET | Freq: Two times a day (BID) | ORAL | Status: DC

## 2013-12-29 NOTE — Patient Instructions (Signed)
Gastroesophageal Reflux Disease, Adult  Gastroesophageal reflux disease (GERD) happens when acid from your stomach flows up into the esophagus. When acid comes in contact with the esophagus, the acid causes soreness (inflammation) in the esophagus. Over time, GERD may create small holes (ulcers) in the lining of the esophagus.  CAUSES   · Increased body weight. This puts pressure on the stomach, making acid rise from the stomach into the esophagus.  · Smoking. This increases acid production in the stomach.  · Drinking alcohol. This causes decreased pressure in the lower esophageal sphincter (valve or ring of muscle between the esophagus and stomach), allowing acid from the stomach into the esophagus.  · Late evening meals and a full stomach. This increases pressure and acid production in the stomach.  · A malformed lower esophageal sphincter.  Sometimes, no cause is found.  SYMPTOMS   · Burning pain in the lower part of the mid-chest behind the breastbone and in the mid-stomach area. This may occur twice a week or more often.  · Trouble swallowing.  · Sore throat.  · Dry cough.  · Asthma-like symptoms including chest tightness, shortness of breath, or wheezing.  DIAGNOSIS   Your caregiver may be able to diagnose GERD based on your symptoms. In some cases, X-rays and other tests may be done to check for complications or to check the condition of your stomach and esophagus.  TREATMENT   Your caregiver may recommend over-the-counter or prescription medicines to help decrease acid production. Ask your caregiver before starting or adding any new medicines.   HOME CARE INSTRUCTIONS   · Change the factors that you can control. Ask your caregiver for guidance concerning weight loss, quitting smoking, and alcohol consumption.  · Avoid foods and drinks that make your symptoms worse, such as:  · Caffeine or alcoholic drinks.  · Chocolate.  · Peppermint or mint flavorings.  · Garlic and onions.  · Spicy foods.  · Citrus fruits,  such as oranges, lemons, or limes.  · Tomato-based foods such as sauce, chili, salsa, and pizza.  · Fried and fatty foods.  · Avoid lying down for the 3 hours prior to your bedtime or prior to taking a nap.  · Eat small, frequent meals instead of large meals.  · Wear loose-fitting clothing. Do not wear anything tight around your waist that causes pressure on your stomach.  · Raise the head of your bed 6 to 8 inches with wood blocks to help you sleep. Extra pillows will not help.  · Only take over-the-counter or prescription medicines for pain, discomfort, or fever as directed by your caregiver.  · Do not take aspirin, ibuprofen, or other nonsteroidal anti-inflammatory drugs (NSAIDs).  SEEK IMMEDIATE MEDICAL CARE IF:   · You have pain in your arms, neck, jaw, teeth, or back.  · Your pain increases or changes in intensity or duration.  · You develop nausea, vomiting, or sweating (diaphoresis).  · You develop shortness of breath, or you faint.  · Your vomit is green, yellow, black, or looks like coffee grounds or blood.  · Your stool is red, bloody, or black.  These symptoms could be signs of other problems, such as heart disease, gastric bleeding, or esophageal bleeding.  MAKE SURE YOU:   · Understand these instructions.  · Will watch your condition.  · Will get help right away if you are not doing well or get worse.  Document Released: 09/20/2005 Document Revised: 03/04/2012 Document Reviewed: 06/30/2011  Exit[employer]® Patient   Information ©2014 [employer].

## 2013-12-29 NOTE — Progress Notes (Signed)
Patient ID: Clifford Rowe, male   DOB: 1986/02/05, 28 y.o.   MRN: [id number] SUBJECTIVE: CC: Chief Complaint  Patient presents with  . Chest Pain    For 3-4- days    HPI:  Chest Pain Clifford Rowe complains of chest pain. Onset was 2 days ago. Symptoms have improved since that time. The patient's pain is constant and is associated with activities. The patient describes the pain as burning and does not radiate. Patient rates pain as a 4/10 in intensity. Associated symptoms are: none. Aggravating factors are: twisting or lift up something the center of the chest was  sore. Alleviating factors are: rest. Patient's cardiac risk factors are: male gender. Patient's risk factors for DVT/PE: none. Previous cardiac testing: none  Breakfast: cereal most times; or eggs sandwich Lunch: depends?? Supper:.chicken casserole and potatoes.  GERD symptoms: yes refluxes at times.   Past Medical History  Diagnosis Date  . GERD (gastroesophageal reflux disease)    Past Surgical History  Procedure Laterality Date  . Cystectomy    . Surgery scrotal / testicular     History   Social History  . Marital Status: Single    Spouse Name: N/A    Number of Children: N/A  . Years of Education: N/A   Occupational History  . unemployed    Social History Main Topics  . Smoking status: Never Smoker   . Smokeless tobacco: Not on file  . Alcohol Use: No  . Drug Use: No  . Sexual Activity: Not on file   Other Topics Concern  . Not on file   Social History Narrative  . No narrative on file   Family History  Problem Relation Age of Onset  . Cancer Father     lung   No current outpatient prescriptions on file prior to visit.   No current facility-administered medications on file prior to visit.   No Known Allergies  There is no immunization history on file for this patient. Prior to Admission medications   Medication Sig Start Date End Date Taking? Authorizing Provider  nystatin-triamcinolone  ointment (MYCOLOG) Apply topically 2 (two) times daily. 10/22/13  Yes Lysbeth Penner, FNP  omeprazole (PRILOSEC) 20 MG capsule TAKE ONE CAPSULE BY MOUTH ONE TIME DAILY 12/13/13  Yes Chipper Herb, MD  naproxen (NAPROSYN) 500 MG tablet Take 1 tablet (500 mg total) by mouth 2 (two) times daily with a meal. 10/22/13   Lysbeth Penner, FNP     ROS: As above in the HPI. All other systems are stable or negative.  OBJECTIVE: APPEARANCE:  Patient in no acute distress.The patient appeared well nourished and normally developed. Acyanotic. Waist: VITAL SIGNS:BP 123/72  Pulse 70  Temp(Src) 96.7 F (35.9 C) (Oral)  Ht 6' 3"  (1.905 m)  Wt 171 lb (77.565 kg)  BMI 21.37 kg/m2 WM slim built.   SKIN: warm and  Dry without overt rashes, tattoos and scars  HEAD and Neck: without JVD, Head and scalp: normal Eyes:No scleral icterus. Fundi normal, eye movements normal. Ears: Auricle normal, canal normal, Tympanic membranes normal, insufflation normal. Nose: normal Throat: normal Neck & thyroid: normal  CHEST & LUNGS: Chest wall: normal Lungs: Clear  CVS: Reveals the PMI to be normally located. Regular rhythm, First and Second Heart sounds are normal,  absence of murmurs, rubs or gallops. Peripheral vasculature: Radial pulses: normal Dorsal pedis pulses: normal Posterior pulses: normal  ABDOMEN:  Appearance: normal Benign, no organomegaly, no masses, no Abdominal Aortic enlargement. No Guarding ,  no rebound. No Bruits. Bowel sounds: normal  RECTAL: N/A GU: N/A  EXTREMETIES: nonedematous.  MUSCULOSKELETAL:  Spine: normal Joints: intact  NEUROLOGIC: oriented to time,place and person; nonfocal. Strength is normal Sensory is normal Reflexes are normal Cranial Nerves are normal.  ASSESSMENT: Chest pain - Plan: EKG 12-Lead, DG Chest 2 View, POCT CBC, CMP14+EGFR, pantoprazole (PROTONIX) 40 MG tablet  Dyspepsia - Plan: CMP14+EGFR, Amylase, Lipase, H Pylori, IGM, IGG, IGA  AB, pantoprazole (PROTONIX) 40 MG tablet Suspect that his  Symptoms is due to GERD. He may have some element of Costochondritis.   PLAN: Handout on GERD given and  Discussed. Will switch his PPI. Moist heat to the chest wall.  Orders Placed This Encounter  Procedures  . DG Chest 2 View    Standing Status: Future     Number of Occurrences: 1     Standing Expiration Date: 02/27/2015    Order Specific Question:  Reason for Exam (SYMPTOM  OR DIAGNOSIS REQUIRED)    Answer:  atypical chest pain    Order Specific Question:  Preferred imaging location?    Answer:  Internal  . CMP14+EGFR  . Amylase  . Lipase  . H Pylori, IGM, IGG, IGA AB  . POCT CBC  . EKG 12-Lead  EKG: normal.  [facility] reading (PRIMARY) by  Dr. Jacelyn Grip: no acute findings.                            Meds ordered this encounter  Medications  . pantoprazole (PROTONIX) 40 MG tablet    Sig: Take 1 tablet (40 mg total) by mouth 2 (two) times daily.    Dispense:  60 tablet    Refill:  3   Medications Discontinued During This Encounter  Medication Reason  . nystatin-triamcinolone ointment (MYCOLOG) Completed Course  . naproxen (NAPROSYN) 500 MG tablet Completed Course  . omeprazole (PRILOSEC) 20 MG capsule Completed Course   Return in about 4 weeks (around 01/26/2014) for Recheck medical problems.  Macon Lesesne P. Jacelyn Grip, M.D.

## 2014-01-01 LAB — LIPASE: Lipase: 26 U/L (ref 0–59)

## 2014-01-01 LAB — CMP14+EGFR
ALT: 15 IU/L (ref 0–44)
AST: 14 IU/L (ref 0–40)
Albumin/Globulin Ratio: 2 (ref 1.1–2.5)
Albumin: 4.9 g/dL (ref 3.5–5.5)
Alkaline Phosphatase: 71 IU/L (ref 39–117)
BUN/Creatinine Ratio: 10 (ref 8–19)
BUN: 10 mg/dL (ref 6–20)
CO2: 26 mmol/L (ref 18–29)
Calcium: 10.3 mg/dL — ABNORMAL HIGH (ref 8.7–10.2)
Chloride: 101 mmol/L (ref 97–108)
Creatinine, Ser: 1.02 mg/dL (ref 0.76–1.27)
GFR calc Af Amer: 114 mL/min/{1.73_m2} (ref >59)
GFR calc non Af Amer: 99 mL/min/{1.73_m2} (ref >59)
Globulin, Total: 2.4 g/dL (ref 1.5–4.5)
Glucose: 90 mg/dL (ref 65–99)
Potassium: 5 mmol/L (ref 3.5–5.2)
Sodium: 143 mmol/L (ref 134–144)
Total Bilirubin: 1.9 mg/dL — ABNORMAL HIGH (ref 0.0–1.2)
Total Protein: 7.3 g/dL (ref 6.0–8.5)

## 2014-01-01 LAB — H PYLORI, IGM, IGG, IGA AB
H Pylori IgG: <0.9 U/mL (ref 0.0–0.8)
H. pylori, IgA Abs: <9 units (ref 0.0–8.9)
H. pylori, IgM Abs: <9 units (ref 0.0–8.9)

## 2014-01-01 LAB — AMYLASE: Amylase: 68 U/L (ref 31–124)

## 2014-01-03 NOTE — Progress Notes (Signed)
Quick Note:  Call patient. Labs normal. No change in plan. ______ 

## 2014-01-14 ENCOUNTER — Other Ambulatory Visit: Payer: Self-pay | Admitting: Family Medicine

## 2014-01-29 ENCOUNTER — Ambulatory Visit (INDEPENDENT_AMBULATORY_CARE_PROVIDER_SITE_OTHER): Payer: Medicare Other | Admitting: Family Medicine

## 2014-01-29 ENCOUNTER — Encounter (INDEPENDENT_AMBULATORY_CARE_PROVIDER_SITE_OTHER): Payer: Self-pay

## 2014-01-29 ENCOUNTER — Encounter: Payer: Self-pay | Admitting: Family Medicine

## 2014-01-29 VITALS — BP 119/68 | HR 65 | Temp 99.0°F | Ht 75.0 in | Wt 170.8 lb

## 2014-01-29 DIAGNOSIS — K219 Gastro-esophageal reflux disease without esophagitis: Secondary | ICD-10-CM | POA: Diagnosis not present

## 2014-01-29 DIAGNOSIS — D225 Melanocytic nevi of trunk: Secondary | ICD-10-CM | POA: Insufficient documentation

## 2014-01-29 DIAGNOSIS — D235 Other benign neoplasm of skin of trunk: Secondary | ICD-10-CM | POA: Diagnosis not present

## 2014-01-29 NOTE — Progress Notes (Signed)
Patient ID: Clifford Rowe, male   DOB: [birth year], 30 y.o.   MRN: [id number] SUBJECTIVE: CC: Chief Complaint  Patient presents with  . Follow-up    1 MONTH FOLLOW UP states doing better    HPI: GERD resolved. Costochondritis controlled with heating pad and NSAID. Had 1 episode after lifting something heavy, where his chest wall was sore for a few days.   Past Medical History  Diagnosis Date  . GERD (gastroesophageal reflux disease)    Past Surgical History  Procedure Laterality Date  . Cystectomy    . Surgery scrotal / testicular     History   Social History  . Marital Status: Single    Spouse Name: N/A    Number of Children: N/A  . Years of Education: N/A   Occupational History  . unemployed    Social History Main Topics  . Smoking status: Never Smoker   . Smokeless tobacco: Not on file  . Alcohol Use: No  . Drug Use: No  . Sexual Activity: Not on file   Other Topics Concern  . Not on file   Social History Narrative  . No narrative on file   Family History  Problem Relation Age of Onset  . Cancer Father     lung   Current Outpatient Prescriptions on File Prior to Visit  Medication Sig Dispense Refill  . pantoprazole (PROTONIX) 40 MG tablet Take 1 tablet (40 mg total) by mouth 2 (two) times daily.  60 tablet  3   No current facility-administered medications on file prior to visit.   No Known Allergies Immunization History  Administered Date(s) Administered  . Tdap 12/25/2012   Prior to Admission medications   Medication Sig Start Date End Date Taking? Authorizing Provider  naproxen (NAPROSYN) 500 MG tablet  10/22/13  Yes Historical Provider, MD  pantoprazole (PROTONIX) 40 MG tablet Take 1 tablet (40 mg total) by mouth 2 (two) times daily. 12/29/13  Yes Vernie Shanks, MD     ROS: As above in the HPI. All other systems are stable or negative.  OBJECTIVE: APPEARANCE:  Patient in no acute distress.The patient appeared well nourished and normally  developed. Acyanotic. Waist: VITAL SIGNS:BP 119/68  Pulse 65  Temp(Src) 99 F (37.2 C) (Oral)  Ht 6\' 3"  (1.905 m)  Wt 170 lb 12.8 oz (77.474 kg)  BMI 21.35 kg/m2 Slim WM   SKIN: warm and  Dry without overt rashes, tattoos and scars Some skin tags and benign nevi. Melanocytic dysplastic nevus: left lumbar area.  HEAD and Neck: without JVD, Head and scalp: normal Eyes:No scleral icterus. Fundi normal, eye movements normal. Ears: Auricle normal, canal normal, Tympanic membranes normal, insufflation normal. Nose: normal Throat: normal Neck & thyroid: normal  CHEST & LUNGS: Chest wall: normal Lungs: Clear  CVS: Reveals the PMI to be normally located. Regular rhythm, First and Second Heart sounds are normal,  absence of murmurs, rubs or gallops. Peripheral vasculature: Radial pulses: normal Dorsal pedis pulses: normal Posterior pulses: normal  ABDOMEN:  Appearance: normal Benign, no organomegaly, no masses, no Abdominal Aortic enlargement. No Guarding , no rebound. No Bruits. Bowel sounds: normal  RECTAL: N/A GU: N/A  EXTREMETIES: nonedematous.  MUSCULOSKELETAL:  Spine: normal Joints: intact  NEUROLOGIC: oriented to time,place and person; nonfocal. Strength is normal Sensory is normal Reflexes are normal Cranial Nerves are normal.  ASSESSMENT:   GERD (gastroesophageal reflux disease)  Melanocytic nevi of trunk See photo in media   PLAN: Discussed with patient about the  dysplastic looking nevus. Will need to do a shave Bx.   No orders of the defined types were placed in this encounter.   Meds ordered this encounter  Medications  . naproxen (NAPROSYN) 500 MG tablet    Sig:   . DISCONTD: omeprazole (PRILOSEC) 20 MG capsule    Sig:    Medications Discontinued During This Encounter  Medication Reason  . omeprazole (PRILOSEC) 20 MG capsule Change in therapy  continue thge omeprazole for 2 months more.   Return in about 2 weeks (around  02/12/2014) for excision of a mole.  Emilea Goga P. Jacelyn Grip, M.D.

## 2014-01-29 NOTE — Patient Instructions (Signed)
Moles  Moles are usually harmless growths on the skin. They are accumulations of color (pigment) cells in the skin that:    Can be various colors, from light brown to black.   Can appear anywhere on the body.   May remain flat or become raised.   May contain hairs.   May remain smooth or develop wrinkling.  Most moles are not cancerous (benign). However, some moles may develop changes and become cancerous. It is important to check your moles every month. If you check your moles regularly, you will be able to notice any changes that may occur.   CAUSES   Moles occur when skin cells grow together in clusters instead of spreading out in the skin as they normally do. The reason for this clustering is unknown.  DIAGNOSIS   Your caregiver will perform a skin examination to diagnose your mole.   TREATMENT   Moles usually do not require treatment. If a mole becomes worrisome, your caregiver may choose to take a sample of the mole or remove it entirely, and then send it to a lab for examination.   HOME CARE INSTRUCTIONS   Check your mole(s) monthly for changes that may indicate skin cancer. These changes can include:   A change in size.   A change in color. Note that moles tend to darken during pregnancy or when taking birth control pills (oral contraception).   A change in shape.   A change in the border of the mole.   Wear sunscreen (with an SPF of at least 30) when you spend long periods of time outside. Reapply the sunscreen every 2 3 hours.   Schedule annual appointments with your skin doctor (dermatologist) if you have a large number of moles.  SEEK MEDICAL CARE IF:   Your mole changes size, especially if it becomes larger than a pencil eraser.   Your mole changes in color or develops more than one color.   Your mole becomes itchy or bleeds.   Your mole, or the skin near the mole, becomes painful, sore, red, or swollen.   Your mole becomes scaly, sheds skin, or oozes fluid.    Your mole develops irregular borders.   Your mole becomes flat or develops raised areas.   Your mole becomes hard or soft.  Document Released: 09/05/2001 Document Revised: 09/04/2012 Document Reviewed: 06/24/2012  [employer] Patient Information 2014 [employer].

## 2014-02-06 DIAGNOSIS — H1030 Unspecified acute conjunctivitis, unspecified eye: Secondary | ICD-10-CM | POA: Diagnosis not present

## 2014-02-13 ENCOUNTER — Ambulatory Visit (INDEPENDENT_AMBULATORY_CARE_PROVIDER_SITE_OTHER): Payer: Medicare Other | Admitting: Family Medicine

## 2014-02-13 ENCOUNTER — Encounter: Payer: Self-pay | Admitting: Family Medicine

## 2014-02-13 VITALS — BP 120/67 | HR 66 | Temp 98.5°F | Ht 75.0 in | Wt 170.6 lb

## 2014-02-13 DIAGNOSIS — D235 Other benign neoplasm of skin of trunk: Secondary | ICD-10-CM | POA: Diagnosis not present

## 2014-02-13 DIAGNOSIS — D485 Neoplasm of uncertain behavior of skin: Secondary | ICD-10-CM | POA: Diagnosis not present

## 2014-02-13 NOTE — Progress Notes (Signed)
Patient ID: Clifford Rowe, male   DOB: 05-06-1986, 28 y.o.   MRN: [id number] SUBJECTIVE: CC: Chief Complaint  Patient presents with  . Follow-up    2week follow up--excise mole left lower back area     HPI:  Past Medical History  Diagnosis Date  . GERD (gastroesophageal reflux disease)    Past Surgical History  Procedure Laterality Date  . Cystectomy    . Surgery scrotal / testicular     History   Social History  . Marital Status: Single    Spouse Name: N/A    Number of Children: N/A  . Years of Education: N/A   Occupational History  . unemployed    Social History Main Topics  . Smoking status: Never Smoker   . Smokeless tobacco: Not on file  . Alcohol Use: No  . Drug Use: No  . Sexual Activity: Not on file   Other Topics Concern  . Not on file   Social History Narrative  . No narrative on file   Family History  Problem Relation Age of Onset  . Cancer Father     lung   Current Outpatient Prescriptions on File Prior to Visit  Medication Sig Dispense Refill  . naproxen (NAPROSYN) 500 MG tablet       . pantoprazole (PROTONIX) 40 MG tablet Take 1 tablet (40 mg total) by mouth 2 (two) times daily.  60 tablet  3   No current facility-administered medications on file prior to visit.   No Known Allergies Immunization History  Administered Date(s) Administered  . Tdap 12/25/2012   Prior to Admission medications   Medication Sig Start Date End Date Taking? Authorizing Provider  naproxen (NAPROSYN) 500 MG tablet  10/22/13  Yes Historical Provider, MD  pantoprazole (PROTONIX) 40 MG tablet Take 1 tablet (40 mg total) by mouth 2 (two) times daily. 12/29/13  Yes Vernie Shanks, MD     ROS: As above in the HPI. All other systems are stable or negative.  OBJECTIVE: APPEARANCE:  Patient in no acute distress.The patient appeared well nourished and normally developed. Acyanotic. Waist: VITAL SIGNS:BP 120/67  Pulse 66  Temp(Src) 98.5 F (36.9 C) (Oral)  Ht 6\' 3"   (1.905 m)  Wt 170 lb 9.6 oz (77.384 kg)  BMI 21.32 kg/m2   SKIN: warm and  Dry without overt rashes, tattoos and scars The lesion is unchanged , see the media picture. It is 6 x 4 mm. And has multiple black pigmented spots in the lesion.  ASSESSMENT: Dysplastic nevus of trunk - Plan: Hepatic function panel, Pathology  PLAN: Procedure: Excision LA: 2 % xylocaine 3.5 cc used .  Actual procedure: The area was cleaned and draped in the usual fashion. Then injected with LA with excellent effect and anesthesia. An ellipitical excision was made with a 2 mm margin  Along the skin lines. Then the specimen was sent to pathology for analysis. 3 3-0 ethylon sutures were used to close with ecellent closure. Area was dressed with antibiotic ointment and a large bandaid.  woundcare discussed.  Tylenol prn pain.  Call if any problems. Orders Placed This Encounter  Procedures  . Hepatic function panel   No orders of the defined types were placed in this encounter.   There are no discontinued medications. Return in about 10 days (around 02/23/2014) for suture removal.  Leith Hedlund P. Jacelyn Grip, M.D.

## 2014-02-17 LAB — PATHOLOGY

## 2014-02-21 NOTE — Progress Notes (Signed)
Quick Note:  Call Patient Labs that are abnormal: The pathology was as I suspected. I will discuss at the suture Removal.  Recommendations: Will need to see a Dermatologist for annual skin check.    ______

## 2014-02-23 ENCOUNTER — Encounter: Payer: Self-pay | Admitting: Family Medicine

## 2014-02-23 ENCOUNTER — Ambulatory Visit (INDEPENDENT_AMBULATORY_CARE_PROVIDER_SITE_OTHER): Payer: Medicare Other | Admitting: Family Medicine

## 2014-02-23 VITALS — BP 128/73 | HR 84 | Temp 98.0°F | Ht 75.0 in | Wt 170.8 lb

## 2014-02-23 DIAGNOSIS — D235 Other benign neoplasm of skin of trunk: Secondary | ICD-10-CM

## 2014-02-23 NOTE — Progress Notes (Signed)
Patient ID: Clifford Rowe, male   DOB: Apr 25, 1986, 28 y.o.   MRN: [id number] SUBJECTIVE: CC: Chief Complaint  Patient presents with  . Suture / Staple Removal    HPI: As above. Recent excision of a dysplastic nevus the left side of the trunk. Wound fine no problems. Past Medical History  Diagnosis Date  . GERD (gastroesophageal reflux disease)    Past Surgical History  Procedure Laterality Date  . Cystectomy    . Surgery scrotal / testicular     History   Social History  . Marital Status: Single    Spouse Name: N/A    Number of Children: N/A  . Years of Education: N/A   Occupational History  . unemployed    Social History Main Topics  . Smoking status: Never Smoker   . Smokeless tobacco: Not on file  . Alcohol Use: No  . Drug Use: No  . Sexual Activity: Not on file   Other Topics Concern  . Not on file   Social History Narrative  . No narrative on file   Family History  Problem Relation Age of Onset  . Cancer Father     lung   Current Outpatient Prescriptions on File Prior to Visit  Medication Sig Dispense Refill  . naproxen (NAPROSYN) 500 MG tablet       . pantoprazole (PROTONIX) 40 MG tablet Take 1 tablet (40 mg total) by mouth 2 (two) times daily.  60 tablet  3   No current facility-administered medications on file prior to visit.   No Known Allergies Immunization History  Administered Date(s) Administered  . Tdap 12/25/2012   Prior to Admission medications   Medication Sig Start Date End Date Taking? Authorizing Provider  naproxen (NAPROSYN) 500 MG tablet  10/22/13  Yes Historical Provider, MD  pantoprazole (PROTONIX) 40 MG tablet Take 1 tablet (40 mg total) by mouth 2 (two) times daily. 12/29/13  Yes Vernie Shanks, MD     ROS: As above in the HPI. All other systems are stable or negative.  OBJECTIVE: APPEARANCE:  Patient in no acute distress.The patient appeared well nourished and normally developed. Acyanotic. Waist: VITAL SIGNS:BP 128/73   Pulse 84  Temp(Src) 98 F (36.7 C) (Oral)  Ht 6\' 3"  (1.905 m)  Wt 170 lb 12.8 oz (77.474 kg)  BMI 21.35 kg/m2   SKIN: warm and  Dry without overt rashes, tattoos. Wound left trunk healed fine. 3 sutures removed. And area dressed with antibiotic ointment.   HEAD and Neck: without JVD, Head and scalp: normal Eyes:No scleral icterus. Fundi normal, eye movements normal. Ears: Auricle normal, canal normal, Tympanic membranes normal, insufflation normal. Nose: normal Throat: normal Neck & thyroid: normal  CHEST & LUNGS: Chest wall: normal Lungs: Clear  CVS: Reveals the PMI to be normally located. Regular rhythm, First and Second Heart sounds are normal,  absence of murmurs, rubs or gallops. Peripheral vasculature: Radial pulses: normal Dorsal pedis pulses: normal Posterior pulses: normal  ABDOMEN:  Appearance: normal Benign, no organomegaly, no masses, no Abdominal Aortic enlargement. No Guarding , no rebound. No Bruits. Bowel sounds: normal  RECTAL: N/A GU: N/A  EXTREMETIES: nonedematous.  MUSCULOSKELETAL:  Spine: normal Joints: intact  NEUROLOGIC: oriented to time,place and person; nonfocal. Strength is normal Sensory is normal Reflexes are normal Cranial Nerves are normal.  ASSESSMENT: Dysplastic nevus of trunk - Plan: Ambulatory referral to Dermatology With the severe dysplasia I would like him followed by a dermatologist on a regular basis. PLAN:  Orders  Placed This Encounter  Procedures  . Ambulatory referral to Dermatology    Referral Priority:  Routine    Referral Type:  Consultation    Referral Reason:  Specialty Services Required    Requested Specialty:  Dermatology    Number of Visits Requested:  1   No orders of the defined types were placed in this encounter.   There are no discontinued medications. Return in about 6 months (around 08/26/2014) for Recheck medical problems.  Jailynn Lavalais P. Jacelyn Grip, M.D.

## 2014-03-18 ENCOUNTER — Encounter: Payer: Self-pay | Admitting: Nurse Practitioner

## 2014-03-18 ENCOUNTER — Telehealth: Payer: Self-pay | Admitting: Family Medicine

## 2014-03-18 ENCOUNTER — Ambulatory Visit (INDEPENDENT_AMBULATORY_CARE_PROVIDER_SITE_OTHER): Payer: Medicare Other | Admitting: Nurse Practitioner

## 2014-03-18 VITALS — BP 127/72 | HR 70 | Temp 97.6°F | Ht 75.0 in | Wt 171.2 lb

## 2014-03-18 DIAGNOSIS — J309 Allergic rhinitis, unspecified: Secondary | ICD-10-CM

## 2014-03-18 DIAGNOSIS — J029 Acute pharyngitis, unspecified: Secondary | ICD-10-CM

## 2014-03-18 LAB — POCT RAPID STREP A (OFFICE): Rapid Strep A Screen: NEGATIVE

## 2014-03-18 MED ORDER — FLUTICASONE PROPIONATE 50 MCG/ACT NA SUSP: 2.0000 | Freq: Every day | NASAL | Status: DC

## 2014-03-18 NOTE — Telephone Encounter (Signed)
Appt given for today 

## 2014-03-18 NOTE — Progress Notes (Signed)
   Subjective:    Patient ID: Clifford Rowe, male    DOB: 1986-08-14, 28 y.o.   MRN: [id number]  HPI  Patient in c/o sore throat X3 days- sore worse in mornings and stops during the day then starts back up at night.    Review of Systems  Constitutional: Negative for fever and chills.  HENT: Positive for congestion, postnasal drip and sore throat. Negative for trouble swallowing.   Respiratory: Negative for cough.   Cardiovascular: Negative.   Gastrointestinal: Negative.   Psychiatric/Behavioral: Negative.   All other systems reviewed and are negative.       Objective:   Physical Exam  Constitutional: He is oriented to person, place, and time. He appears well-developed and well-nourished.  HENT:  Right Ear: Hearing, tympanic membrane, external ear and ear canal normal.  Left Ear: Hearing, tympanic membrane, external ear and ear canal normal.  Nose: Mucosal edema and rhinorrhea present. Right sinus exhibits no maxillary sinus tenderness and no frontal sinus tenderness. Left sinus exhibits no maxillary sinus tenderness and no frontal sinus tenderness.  Mouth/Throat: Uvula is midline. Posterior oropharyngeal erythema (very mild) present.  Eyes: Pupils are equal, round, and reactive to light.  Neck: Normal range of motion. Neck supple.  Cardiovascular: Normal rate, regular rhythm and normal heart sounds.   Pulmonary/Chest: Effort normal and breath sounds normal. No respiratory distress. He has no wheezes. He has no rales. He exhibits no tenderness.  Lymphadenopathy:    He has no cervical adenopathy.  Neurological: He is alert and oriented to person, place, and time.  Skin: Skin is warm and dry.  Psychiatric: He has a normal mood and affect. His behavior is normal. Judgment and thought content normal.   BP 127/72  Pulse 70  Temp(Src) 97.6 F (36.4 C) (Oral)  Ht 6\' 3"  (1.905 m)  Wt 171 lb 3.2 oz (77.656 kg)  BMI 21.40 kg/m2  Results for orders placed in visit on 03/18/14  POCT  RAPID STREP A (OFFICE)      Result Value Ref Range   Rapid Strep A Screen Negative  Negative         Assessment & Plan:   1. Sore throat   2. Allergic rhinitis    Meds ordered this encounter  Medications  . fluticasone (FLONASE) 50 MCG/ACT nasal spray    Sig: Place 2 sprays into both nostrils daily.    Dispense:  16 g    Refill:  6    Order Specific Question:  Supervising Provider    Answer:  Joycelyn Man   Force fluids Motrin or tylenol OTC OTC decongestant Throat lozenges if help New toothbrush in 3 days RTO prn  Mary-Margaret Hassell Done, FNP

## 2014-03-18 NOTE — Patient Instructions (Signed)
Allergic Rhinitis Allergic rhinitis is when the mucous membranes in the nose respond to allergens. Allergens are particles in the air that cause your body to have an allergic reaction. This causes you to release allergic antibodies. Through a chain of events, these eventually cause you to release histamine into the blood stream. Although meant to protect the body, it is this release of histamine that causes your discomfort, such as frequent sneezing, congestion, and an itchy, runny nose.  CAUSES  Seasonal allergic rhinitis (hay fever) is caused by pollen allergens that may come from grasses, trees, and weeds. Year-round allergic rhinitis (perennial allergic rhinitis) is caused by allergens such as house dust mites, pet dander, and mold spores.  SYMPTOMS   Nasal stuffiness (congestion).  Itchy, runny nose with sneezing and tearing of the eyes. DIAGNOSIS  Your health care provider can help you determine the allergen or allergens that trigger your symptoms. If you and your health care provider are unable to determine the allergen, skin or blood testing may be used. TREATMENT  Allergic Rhinitis does not have a cure, but it can be controlled by:  Medicines and allergy shots (immunotherapy).  Avoiding the allergen. Hay fever may often be treated with antihistamines in pill or nasal spray forms. Antihistamines block the effects of histamine. There are over-the-counter medicines that may help with nasal congestion and swelling around the eyes. Check with your health care provider before taking or giving this medicine.  If avoiding the allergen or the medicine prescribed do not work, there are many new medicines your health care provider can prescribe. Stronger medicine may be used if initial measures are ineffective. Desensitizing injections can be used if medicine and avoidance does not work. Desensitization is when a patient is given ongoing shots until the body becomes less sensitive to the allergen.  Make sure you follow up with your health care provider if problems continue. HOME CARE INSTRUCTIONS It is not possible to completely avoid allergens, but you can reduce your symptoms by taking steps to limit your exposure to them. It helps to know exactly what you are allergic to so that you can avoid your specific triggers. SEEK MEDICAL CARE IF:   You have a fever.  You develop a cough that does not stop easily (persistent).  You have shortness of breath.  You start wheezing.  Symptoms interfere with normal daily activities. Document Released: 09/05/2001 Document Revised: 10/01/2013 Document Reviewed: 08/18/2013 [employer] Patient Information 2014 [employer].  

## 2014-03-25 DIAGNOSIS — L905 Scar conditions and fibrosis of skin: Secondary | ICD-10-CM | POA: Diagnosis not present

## 2014-03-25 DIAGNOSIS — L255 Unspecified contact dermatitis due to plants, except food: Secondary | ICD-10-CM | POA: Diagnosis not present

## 2014-03-25 DIAGNOSIS — D235 Other benign neoplasm of skin of trunk: Secondary | ICD-10-CM | POA: Diagnosis not present

## 2014-03-26 ENCOUNTER — Encounter: Payer: Self-pay | Admitting: Family Medicine

## 2014-03-26 ENCOUNTER — Ambulatory Visit (INDEPENDENT_AMBULATORY_CARE_PROVIDER_SITE_OTHER): Payer: Medicare Other | Admitting: Family Medicine

## 2014-03-26 VITALS — BP 124/74 | HR 64 | Temp 97.7°F | Ht 75.0 in | Wt 170.0 lb

## 2014-03-26 DIAGNOSIS — L259 Unspecified contact dermatitis, unspecified cause: Secondary | ICD-10-CM

## 2014-03-26 MED ORDER — METHYLPREDNISOLONE ACETATE 80 MG/ML IJ SUSP
80.0000 mg | Freq: Once | INTRAMUSCULAR | Status: AC
  Administered 2014-03-26: 80 mg via INTRAMUSCULAR

## 2014-03-26 MED ORDER — METHYLPREDNISOLONE (PAK) 4 MG PO TABS: ORAL_TABLET | ORAL | Status: DC

## 2014-03-26 MED ORDER — HYDROXYZINE HCL 25 MG PO TABS: 25.0000 mg | ORAL_TABLET | Freq: Three times a day (TID) | ORAL | Status: DC | PRN

## 2014-03-26 NOTE — Progress Notes (Signed)
   Subjective:    Patient ID: Clifford Rowe, male    DOB: 1986/09/06, 28 y.o.   MRN: [id number]  HPI  This 28 y.o. male presents for evaluation of contact dermatitis due to poison oak on his arms Chest, abdomen, and inguinal area.  Review of Systems C/o rash   No chest pain, SOB, HA, dizziness, vision change, N/V, diarrhea, constipation, dysuria, urinary urgency or frequency, myalgias, arthralgias.  Objective:   Physical Exam Vital signs noted  Well developed well nourished male.  HEENT - Head atraumatic Normocephalic                Eyes - PERRLA, Conjuctiva - clear Sclera- Clear EOMI                Ears - EAC's Wnl TM's Wnl Gross Hearing WNL                Throat - oropharanx wnl Respiratory - Lungs CTA bilateral Cardiac - RRR S1 and S2 without murmur GI - Abdomen soft Nontender and bowel sounds active x 4 Extremities - No edema. Derm - Erythematous raised rash over arms, chest, abdomen, and inguinal area       Assessment & Plan:  Contact dermatitis - Plan: methylPREDNIsolone (MEDROL DOSPACK) 4 MG tablet, hydrOXYzine (ATARAX/VISTARIL) 25 MG tablet, methylPREDNISolone acetate (DEPO-MEDROL) injection 80 mg  Lysbeth Penner FNP

## 2014-05-04 ENCOUNTER — Other Ambulatory Visit: Payer: Self-pay | Admitting: Family Medicine

## 2014-05-11 ENCOUNTER — Ambulatory Visit (INDEPENDENT_AMBULATORY_CARE_PROVIDER_SITE_OTHER): Payer: Medicare Other | Admitting: Family Medicine

## 2014-05-11 ENCOUNTER — Encounter: Payer: Self-pay | Admitting: Family Medicine

## 2014-05-11 VITALS — BP 132/74 | HR 77 | Temp 98.9°F | Ht 75.0 in | Wt 165.6 lb

## 2014-05-11 DIAGNOSIS — L255 Unspecified contact dermatitis due to plants, except food: Secondary | ICD-10-CM | POA: Diagnosis not present

## 2014-05-11 DIAGNOSIS — L237 Allergic contact dermatitis due to plants, except food: Secondary | ICD-10-CM

## 2014-05-11 MED ORDER — METHYLPREDNISOLONE ACETATE 80 MG/ML IJ SUSP
60.0000 mg | Freq: Once | INTRAMUSCULAR | Status: AC
Start: 2014-05-11 — End: 2014-05-11
  Administered 2014-05-11: 60 mg via INTRAMUSCULAR

## 2014-05-11 MED ORDER — METHYLPREDNISOLONE (PAK) 4 MG PO TABS: ORAL_TABLET | ORAL | Status: DC

## 2014-05-11 NOTE — Progress Notes (Signed)
   Subjective:    Patient ID: Clifford Rowe, male    DOB: 1986-10-26, 28 y.o.   MRN: [id number]  HPI C/o rash on arms and legs and abdomen due to poison ivy exposure   Review of Systems No chest pain, SOB, HA, dizziness, vision change, N/V, diarrhea, constipation, dysuria, urinary urgency or frequency, myalgias, arthralgias or rash.     Objective:   Physical Exam  Vital signs noted  Well developed well nourished male.  HEENT - Head atraumatic Normocephalic Respiratory - Lungs CTA bilateral Cardiac - RRR S1 and S2 without murmur Skin - Erythematous rash over arms, legs, and abdomen      Assessment & Plan:  Poison oak dermatitis - Plan: methylPREDNISolone acetate (DEPO-MEDROL) injection 60 mg, methylPREDNIsolone (MEDROL DOSPACK) 4 MG tablet  Lysbeth Penner FNP

## 2014-06-06 ENCOUNTER — Ambulatory Visit (INDEPENDENT_AMBULATORY_CARE_PROVIDER_SITE_OTHER): Payer: Medicare Other | Admitting: General Practice

## 2014-06-06 ENCOUNTER — Encounter: Payer: Self-pay | Admitting: General Practice

## 2014-06-06 VITALS — BP 126/72 | HR 68 | Temp 98.1°F | Ht 75.0 in | Wt 169.0 lb

## 2014-06-06 DIAGNOSIS — M940 Chondrocostal junction syndrome [Tietze]: Secondary | ICD-10-CM | POA: Diagnosis not present

## 2014-06-06 NOTE — Patient Instructions (Signed)

## 2014-06-06 NOTE — Progress Notes (Signed)
   Subjective:    Patient ID: Clifford Rowe, male    DOB: 23-Dec-1986, 28 y.o.   MRN: [id number]  Muscle Pain This is a new problem. The current episode started in the past 7 days. The problem occurs intermittently. The problem has been gradually improving since onset. The pain occurs in the context of recent physical stress. Pain location: Pain location right chest wall. The pain is mild. Pertinent negatives include no chest pain, fever, headaches, shortness of breath or weakness. Past treatments include nothing. There is no swelling present.  Patient reports playing and throwing the football with his nephew 1-2 before onset of pain in right chest muscles. He denies shortness of breath, headache, dizziness, arm pain, or other symptoms.     Review of Systems  Constitutional: Negative for fever and chills.  Respiratory: Negative for chest tightness and shortness of breath.   Cardiovascular: Negative for chest pain and palpitations.  Musculoskeletal:       Right chest wall tenderness   Neurological: Negative for dizziness, weakness and headaches.       Objective:   Physical Exam  Constitutional: He is oriented to person, place, and time. He appears well-developed and well-nourished.  Cardiovascular: Normal rate, regular rhythm and normal heart sounds.   Pulmonary/Chest: Effort normal and breath sounds normal. He exhibits tenderness. He exhibits no mass, no edema and no deformity.  Pain reproducible  Neurological: He is alert and oriented to person, place, and time.  Skin: Skin is warm and dry.  Psychiatric: He has a normal mood and affect.          Assessment & Plan:  1. Costochondritis -discussed and provided educational material -may take OTC NSAIDs as directed -heat/cold compresses as discussed -RTO if symptoms worsen or unresolved Patient verbalized understanding Erby Pian, FNP-C

## 2014-06-11 DIAGNOSIS — R0602 Shortness of breath: Secondary | ICD-10-CM | POA: Diagnosis not present

## 2014-06-11 DIAGNOSIS — J029 Acute pharyngitis, unspecified: Secondary | ICD-10-CM | POA: Diagnosis not present

## 2014-06-11 DIAGNOSIS — R6883 Chills (without fever): Secondary | ICD-10-CM | POA: Diagnosis not present

## 2014-06-11 DIAGNOSIS — H6690 Otitis media, unspecified, unspecified ear: Secondary | ICD-10-CM | POA: Diagnosis not present

## 2014-07-10 ENCOUNTER — Encounter: Payer: Self-pay | Admitting: Nurse Practitioner

## 2014-07-10 ENCOUNTER — Ambulatory Visit (INDEPENDENT_AMBULATORY_CARE_PROVIDER_SITE_OTHER): Payer: Medicare Other

## 2014-07-10 ENCOUNTER — Ambulatory Visit (INDEPENDENT_AMBULATORY_CARE_PROVIDER_SITE_OTHER): Payer: Medicare Other | Admitting: Nurse Practitioner

## 2014-07-10 ENCOUNTER — Telehealth: Payer: Self-pay | Admitting: Family Medicine

## 2014-07-10 VITALS — BP 118/72 | HR 64 | Temp 99.1°F | Ht 75.0 in | Wt 169.4 lb

## 2014-07-10 DIAGNOSIS — M25532 Pain in left wrist: Secondary | ICD-10-CM

## 2014-07-10 DIAGNOSIS — M65839 Other synovitis and tenosynovitis, unspecified forearm: Secondary | ICD-10-CM | POA: Diagnosis not present

## 2014-07-10 DIAGNOSIS — M25539 Pain in unspecified wrist: Secondary | ICD-10-CM

## 2014-07-10 DIAGNOSIS — M65849 Other synovitis and tenosynovitis, unspecified hand: Secondary | ICD-10-CM | POA: Diagnosis not present

## 2014-07-10 DIAGNOSIS — M778 Other enthesopathies, not elsewhere classified: Secondary | ICD-10-CM

## 2014-07-10 MED ORDER — NAPROXEN 500 MG PO TABS: 500.0000 mg | ORAL_TABLET | Freq: Two times a day (BID) | ORAL | Status: DC

## 2014-07-10 NOTE — Progress Notes (Signed)
   Subjective:    Patient ID: Clifford Rowe, male    DOB: 02/22/1986, 28 y.o.   MRN: [id number]  HPI Patient in c/o left wrist pain- he denies any injury- was pulling carpet up yesterday and that has increased his pain- started hurting about 4-5 days ago. Some movement increases pain.    Review of Systems  Constitutional: Negative.   Respiratory: Negative.   Cardiovascular: Negative.   Neurological: Negative.   Psychiatric/Behavioral: Negative.   All other systems reviewed and are negative.      Objective:   Physical Exam  Constitutional: He appears well-developed and well-nourished.  Cardiovascular: Normal rate, regular rhythm and normal heart sounds.   Pulmonary/Chest: Effort normal and breath sounds normal.  Musculoskeletal:  3cm tender ganglion cyst dorsal surface of left hand Pain with flexion and extension f left wrist- no edema  Neurological: He is alert. He has normal reflexes. No cranial nerve deficit.  Skin: Skin is warm and dry.  Psychiatric: He has a normal mood and affect. His behavior is normal. Judgment and thought content normal.   BP 118/72  Pulse 64  Temp(Src) 99.1 F (37.3 C) (Oral)  Ht 6\' 3"  (1.905 m)  Wt 169 lb 6.4 oz (76.839 kg)  BMI 21.17 kg/m2        Assessment & Plan:   1. Left wrist pain   2. Left wrist tendonitis    Meds ordered this encounter  Medications  . naproxen (NAPROSYN) 500 MG tablet    Sig: Take 1 tablet (500 mg total) by mouth 2 (two) times daily with a meal.    Dispense:  60 tablet    Refill:  1    Order Specific Question:  Supervising Provider    Answer:  Chipper Herb [[id number]]   Rest wrist- if hurts when doing something then stop Moist heat Buy velcro wrist brace OTC and wear as much as possible rto prn  Mary-Margaret Hassell Done, FNP

## 2014-07-10 NOTE — Telephone Encounter (Signed)
appt given for today at 5 but patient is going to come at 5:30 for xray

## 2014-07-10 NOTE — Patient Instructions (Signed)

## 2014-07-27 ENCOUNTER — Other Ambulatory Visit: Payer: Self-pay | Admitting: Nurse Practitioner

## 2014-08-12 DIAGNOSIS — H1045 Other chronic allergic conjunctivitis: Secondary | ICD-10-CM | POA: Diagnosis not present

## 2014-08-12 DIAGNOSIS — G44209 Tension-type headache, unspecified, not intractable: Secondary | ICD-10-CM | POA: Diagnosis not present

## 2014-08-25 ENCOUNTER — Ambulatory Visit (INDEPENDENT_AMBULATORY_CARE_PROVIDER_SITE_OTHER): Payer: Medicare Other | Admitting: Family Medicine

## 2014-08-25 ENCOUNTER — Encounter: Payer: Self-pay | Admitting: Family Medicine

## 2014-08-25 VITALS — BP 113/70 | HR 63 | Temp 99.4°F | Ht 75.0 in | Wt 169.0 lb

## 2014-08-25 DIAGNOSIS — R1031 Right lower quadrant pain: Secondary | ICD-10-CM | POA: Diagnosis not present

## 2014-08-25 LAB — POCT URINALYSIS DIPSTICK
Bilirubin, UA: NEGATIVE
Blood, UA: NEGATIVE
Glucose, UA: NEGATIVE
Ketones, UA: NEGATIVE
Leukocytes, UA: NEGATIVE
Nitrite, UA: NEGATIVE
Protein, UA: NEGATIVE
Spec Grav, UA: 1.01
Urobilinogen, UA: NEGATIVE
pH, UA: 7

## 2014-08-25 LAB — POCT CBC
Granulocyte percent: 74.9 %G (ref 37–80)
HCT, POC: 48.3 % (ref 43.5–53.7)
Hemoglobin: 16.1 g/dL (ref 14.1–18.1)
Lymph, poc: 1.6 (ref 0.6–3.4)
MCH, POC: 29.3 pg (ref 27–31.2)
MCHC: 33.3 g/dL (ref 31.8–35.4)
MCV: 87.7 fL (ref 80–97)
MPV: 9.8 fL (ref 0–99.8)
POC Granulocyte: 5.7 (ref 2–6.9)
POC LYMPH PERCENT: 20.7 %L (ref 10–50)
Platelet Count, POC: 186 10*3/uL (ref 142–424)
RBC: 5.5 M/uL (ref 4.69–6.13)
RDW, POC: 14.2 %
WBC: 7.6 10*3/uL (ref 4.6–10.2)

## 2014-08-25 LAB — POCT UA - MICROSCOPIC ONLY
Casts, Ur, LPF, POC: NEGATIVE
Crystals, Ur, HPF, POC: NEGATIVE
Mucus, UA: NEGATIVE
RBC, urine, microscopic: NEGATIVE
Yeast, UA: NEGATIVE

## 2014-08-25 NOTE — Progress Notes (Signed)
   Subjective:    Patient ID: Clifford Rowe, male    DOB: [birth year], 28 y.o.   MRN: [id number]  HPI  This 28 y.o. male presents for evaluation of right upper quadrant abdominal pain.  He has had persistent right quadrant abdominal pain for a week.  Review of Systems C/o right upper quadrant abdominal pain   No chest pain, SOB, HA, dizziness, vision change, N/V, diarrhea, constipation, dysuria, urinary urgency or frequency, myalgias, arthralgias or rash.  Objective:   Physical Exam  Vital signs noted  Well developed well nourished male.  HEENT - Head atraumatic Normocephalic                Eyes - PERRLA, Conjuctiva - clear Sclera- Clear EOMI                Ears - EAC's Wnl TM's Wnl Gross Hearing WNL                 Throat - oropharanx wnl Respiratory - Lungs CTA bilateral Cardiac - RRR S1 and S2 without murmur GI - Abdomen tender right upper quadrant and mass right upper quadrant.  Results for orders placed in visit on 08/25/14  POCT CBC      Result Value Ref Range   WBC 7.6  4.6 - 10.2 K/uL   Lymph, poc 1.6  0.6 - 3.4   POC LYMPH PERCENT 20.7  10 - 50 %L   POC Granulocyte 5.7  2 - 6.9   Granulocyte percent 74.9  37 - 80 %G   RBC 5.5  4.69 - 6.13 M/uL   Hemoglobin 16.1  14.1 - 18.1 g/dL   HCT, POC 48.3  43.5 - 53.7 %   MCV 87.7  80 - 97 fL   MCH, POC 29.3  27 - 31.2 pg   MCHC 33.3  31.8 - 35.4 g/dL   RDW, POC 14.2     Platelet Count, POC 186.0  142 - 424 K/uL   MPV 9.8  0 - 99.8 fL      Assessment & Plan:  RLQ abdominal pain - Plan: POCT CBC, POCT UA - Microscopic Only, POCT urinalysis dipstick, US Abdomen Limited RUQ, DG Abd 1 View  Lysbeth Penner FNP

## 2014-08-26 ENCOUNTER — Other Ambulatory Visit (INDEPENDENT_AMBULATORY_CARE_PROVIDER_SITE_OTHER): Payer: Medicare Other

## 2014-08-26 DIAGNOSIS — R1031 Right lower quadrant pain: Secondary | ICD-10-CM | POA: Diagnosis not present

## 2014-09-02 ENCOUNTER — Ambulatory Visit (HOSPITAL_COMMUNITY)
Admission: RE | Admit: 2014-09-02 | Discharge: 2014-09-02 | Disposition: A | Discharge status: Home / Self Care | Payer: Medicare Other | Source: Ambulatory Visit | Attending: Family Medicine | Admitting: Family Medicine

## 2014-09-02 DIAGNOSIS — R1031 Right lower quadrant pain: Secondary | ICD-10-CM

## 2014-09-02 DIAGNOSIS — R1011 Right upper quadrant pain: Secondary | ICD-10-CM | POA: Diagnosis not present

## 2014-09-23 ENCOUNTER — Ambulatory Visit (INDEPENDENT_AMBULATORY_CARE_PROVIDER_SITE_OTHER): Payer: Medicare Other | Admitting: Family Medicine

## 2014-09-23 ENCOUNTER — Encounter: Payer: Self-pay | Admitting: Family Medicine

## 2014-09-23 VITALS — BP 118/66 | HR 84 | Temp 99.9°F | Ht 75.0 in | Wt 169.0 lb

## 2014-09-23 DIAGNOSIS — M545 Low back pain, unspecified: Secondary | ICD-10-CM | POA: Diagnosis not present

## 2014-09-23 MED ORDER — NAPROXEN 500 MG PO TABS: 500.0000 mg | ORAL_TABLET | Freq: Two times a day (BID) | ORAL | Status: DC

## 2014-09-23 MED ORDER — CYCLOBENZAPRINE HCL 10 MG PO TABS: 10.0000 mg | ORAL_TABLET | Freq: Three times a day (TID) | ORAL | Status: DC | PRN

## 2014-09-23 NOTE — Progress Notes (Signed)
   Subjective:    Patient ID: Clifford Rowe, male    DOB: [birth year], 28 y.o.   MRN: [id number]  HPI This 28 y.o. male presents for evaluation of lumbar back pain on the right side.  He denies any Injury.   Review of Systems C/o back pain No chest pain, SOB, HA, dizziness, vision change, N/V, diarrhea, constipation, dysuria, urinary urgency or frequency, myalgias, arthralgias or rash.     Objective:   Physical Exam  Vital signs noted  Well developed well nourished male.  HEENT - Head atraumatic Normocephalic Respiratory - Lungs CTA bilateral Cardiac - RRR S1 and S2 without murmur GI - Abdomen soft Nontender and bowel sounds active x 4 Extremities - No edema. Neuro - Grossly intact. MS - TTP right LS muscles     Assessment & Plan:  Right-sided low back pain without sciatica - Plan: cyclobenzaprine (FLEXERIL) 10 MG tablet, naproxen (NAPROSYN) 500 MG tablet  Lysbeth Penner FNP

## 2014-12-04 ENCOUNTER — Other Ambulatory Visit: Payer: Self-pay | Admitting: Nurse Practitioner

## 2014-12-17 ENCOUNTER — Other Ambulatory Visit: Payer: Self-pay | Admitting: Nurse Practitioner

## 2015-01-04 ENCOUNTER — Encounter: Payer: Self-pay | Admitting: Family Medicine

## 2015-01-04 ENCOUNTER — Ambulatory Visit (INDEPENDENT_AMBULATORY_CARE_PROVIDER_SITE_OTHER): Payer: Medicare Other | Admitting: Family Medicine

## 2015-01-04 VITALS — BP 139/79 | HR 73 | Temp 97.9°F | Ht 75.0 in | Wt 167.4 lb

## 2015-01-04 DIAGNOSIS — N451 Epididymitis: Secondary | ICD-10-CM | POA: Diagnosis not present

## 2015-01-04 MED ORDER — CIPROFLOXACIN HCL 500 MG PO TABS: 500.0000 mg | ORAL_TABLET | Freq: Two times a day (BID) | ORAL | Status: DC

## 2015-01-04 NOTE — Progress Notes (Signed)
   Subjective:    Patient ID: Clifford Rowe, male    DOB: 05/06/1986, 29 y.o.   MRN: [id number]  HPI Patient is here for c/o left testicular discomfort.    Review of Systems  Constitutional: Negative for fever.  HENT: Negative for ear pain.   Eyes: Negative for discharge.  Respiratory: Negative for cough.   Cardiovascular: Negative for chest pain.  Gastrointestinal: Negative for abdominal distention.  Endocrine: Negative for polyuria.  Genitourinary: Negative for difficulty urinating.  Musculoskeletal: Negative for gait problem and neck pain.  Skin: Negative for color change and rash.  Neurological: Negative for speech difficulty and headaches.  Psychiatric/Behavioral: Negative for agitation.       Objective:    BP 139/79 mmHg  Pulse 73  Temp(Src) 97.9 F (36.6 C) (Oral)  Ht 6\' 3"  (1.905 m)  Wt 167 lb 6.4 oz (75.932 kg)  BMI 20.92 kg/m2 Physical Exam  Constitutional: He is oriented to person, place, and time. He appears well-developed and well-nourished.  HENT:  Head: Normocephalic and atraumatic.  Mouth/Throat: Oropharynx is clear and moist.  Eyes: Pupils are equal, round, and reactive to light.  Neck: Normal range of motion. Neck supple.  Cardiovascular: Normal rate and regular rhythm.   No murmur heard. Pulmonary/Chest: Effort normal and breath sounds normal.  Abdominal: Soft. Bowel sounds are normal. There is no tenderness.  Genitourinary:  TTP left testicle with left testicular swelling. No inguinal hernia bilateral.  Neurological: He is alert and oriented to person, place, and time.  Skin: Skin is warm and dry.  Psychiatric: He has a normal mood and affect.          Assessment & Plan:     ICD-9-CM ICD-10-CM   1. Epididymitis 604.90 N45.1 ciprofloxacin (CIPRO) 500 MG tablet     No Follow-up on file.  Lysbeth Penner FNP

## 2015-01-23 ENCOUNTER — Telehealth: Payer: Self-pay | Admitting: Family Medicine

## 2015-01-26 ENCOUNTER — Other Ambulatory Visit: Payer: Self-pay | Admitting: Family Medicine

## 2015-01-26 ENCOUNTER — Ambulatory Visit (HOSPITAL_COMMUNITY)
Admission: RE | Admit: 2015-01-26 | Discharge: 2015-01-26 | Disposition: A | Discharge status: Home / Self Care | Payer: Medicare Other | Source: Ambulatory Visit | Attending: Family Medicine | Admitting: Family Medicine

## 2015-01-26 DIAGNOSIS — N50812 Left testicular pain: Secondary | ICD-10-CM

## 2015-01-26 DIAGNOSIS — N508 Other specified disorders of male genital organs: Secondary | ICD-10-CM | POA: Insufficient documentation

## 2015-01-26 DIAGNOSIS — I861 Scrotal varices: Secondary | ICD-10-CM | POA: Diagnosis not present

## 2015-01-26 NOTE — Telephone Encounter (Signed)
I have ordered stat US of Left testicle and Urology referral

## 2015-01-26 NOTE — Telephone Encounter (Signed)
Pt aware referral has been sent and ultrasound ordered. He will receive call with appt time and all the details.

## 2015-02-08 DIAGNOSIS — N509 Disorder of male genital organs, unspecified: Secondary | ICD-10-CM | POA: Diagnosis not present

## 2015-03-18 DIAGNOSIS — N509 Disorder of male genital organs, unspecified: Secondary | ICD-10-CM | POA: Diagnosis not present

## 2015-03-18 DIAGNOSIS — I861 Scrotal varices: Secondary | ICD-10-CM | POA: Diagnosis not present

## 2015-04-19 ENCOUNTER — Other Ambulatory Visit: Payer: Self-pay | Admitting: Family Medicine

## 2015-06-21 ENCOUNTER — Encounter: Payer: Self-pay | Admitting: Physician Assistant

## 2015-06-21 ENCOUNTER — Encounter (INDEPENDENT_AMBULATORY_CARE_PROVIDER_SITE_OTHER): Payer: Self-pay

## 2015-06-21 ENCOUNTER — Ambulatory Visit (INDEPENDENT_AMBULATORY_CARE_PROVIDER_SITE_OTHER): Payer: Medicare Other | Admitting: Physician Assistant

## 2015-06-21 VITALS — BP 135/80 | HR 69 | Temp 98.1°F | Ht 75.0 in | Wt 169.0 lb

## 2015-06-21 DIAGNOSIS — R5383 Other fatigue: Secondary | ICD-10-CM | POA: Diagnosis not present

## 2015-06-21 DIAGNOSIS — R42 Dizziness and giddiness: Secondary | ICD-10-CM | POA: Diagnosis not present

## 2015-06-21 LAB — POCT CBC
Granulocyte percent: 63.8 %G (ref 37–80)
HCT, POC: 50 % (ref 43.5–53.7)
Hemoglobin: 16 g/dL (ref 14.1–18.1)
Lymph, poc: 1.6 (ref 0.6–3.4)
MCH, POC: 28 pg (ref 27–31.2)
MCHC: 32.1 g/dL (ref 31.8–35.4)
MCV: 87 fL (ref 80–97)
MPV: 10.2 fL (ref 0–99.8)
POC Granulocyte: 3.9 (ref 2–6.9)
POC LYMPH PERCENT: 25.6 %L (ref 10–50)
Platelet Count, POC: 174 10*3/uL (ref 142–424)
RBC: 5.74 M/uL (ref 4.69–6.13)
RDW, POC: 13.9 %
WBC: 6.1 10*3/uL (ref 4.6–10.2)

## 2015-06-21 LAB — GLUCOSE, POCT (MANUAL RESULT ENTRY): POC GLUCOSE: 98 mg/dL (ref 70–99)

## 2015-06-21 NOTE — Addendum Note (Signed)
Addended by: Pollyann Kennedy F on: 06/21/2015 10:39 AM   Modules accepted: Orders

## 2015-06-21 NOTE — Progress Notes (Signed)
   Subjective:    Patient ID: Clifford Rowe, male    DOB: 05/12/1986, 29 y.o.   MRN: [id number]  HPI 29 y/o male presents with c/o tiredness x 4-5 days with occasional associated dizziness. States that he "feels rough". No associated sick contacts. Got a tick off of him 3-4 months ago.     Review of Systems  Constitutional: Positive for activity change (increased tiredness ) and fatigue. Negative for fever, chills, diaphoresis, appetite change and unexpected weight change.  HENT: Negative.   Eyes: Negative.   Respiratory: Negative.   Cardiovascular: Negative.   Gastrointestinal: Negative.   Genitourinary: Negative for dysuria, urgency, frequency, hematuria and difficulty urinating.  Musculoskeletal: Positive for back pain (chronic ).  Skin: Negative.   Neurological: Positive for dizziness (once every two days, ususally when hes outside working ) and weakness. Negative for syncope and numbness.       Objective:   Physical Exam  Constitutional: He is oriented to person, place, and time. He appears well-developed and well-nourished. No distress.  HENT:  Head: Normocephalic and atraumatic.  Eyes: Right eye exhibits no discharge. Left eye exhibits no discharge. No scleral icterus.  Cardiovascular: Normal rate, regular rhythm, normal heart sounds and intact distal pulses.  Exam reveals no gallop and no friction rub.   No murmur heard. Pulmonary/Chest: Effort normal and breath sounds normal. No respiratory distress. He has no wheezes. He has no rales. He exhibits no tenderness.  Musculoskeletal: Normal range of motion. He exhibits no edema or tenderness.  Neurological: He is alert and oriented to person, place, and time.  Skin: No rash noted. He is not diaphoretic. No erythema. No pallor.  Psychiatric: He has a normal mood and affect. His behavior is normal. Judgment and thought content normal.  Nursing note and vitals reviewed.         Assessment & Plan:  1. Other fatigue  - POCT  CBC - POCT glucose (manual entry) - POCT SEDIMENTATION RATE - Lyme Ab/Western Blot Reflex - CMP14+EGFR - Testosterone,Free and Total - TSH  2. Dizziness  - POCT CBC - POCT glucose (manual entry) - POCT SEDIMENTATION RATE - Lyme Ab/Western Blot Reflex - CMP14+EGFR - Testosterone,Free and Total - TSH  Labs pending Drink plenty of fluids while working outside to prevent dehydration   F/U depending on lab results   Tiffany A. Gann PA-C   

## 2015-06-22 DIAGNOSIS — N509 Disorder of male genital organs, unspecified: Secondary | ICD-10-CM | POA: Diagnosis not present

## 2015-06-22 LAB — CMP14+EGFR
ALBUMIN: 4.5 g/dL (ref 3.5–5.5)
ALK PHOS: 71 IU/L (ref 39–117)
ALT: 14 IU/L (ref 0–44)
AST: 13 IU/L (ref 0–40)
Albumin/Globulin Ratio: 1.9 (ref 1.1–2.5)
BILIRUBIN TOTAL: 1.8 mg/dL — AB (ref 0.0–1.2)
BUN/Creatinine Ratio: 12 (ref 8–19)
BUN: 12 mg/dL (ref 6–20)
CHLORIDE: 102 mmol/L (ref 97–108)
CO2: 24 mmol/L (ref 18–29)
Calcium: 9.5 mg/dL (ref 8.7–10.2)
Creatinine, Ser: 0.98 mg/dL (ref 0.76–1.27)
GFR calc non Af Amer: 103 mL/min/{1.73_m2} (ref >59)
GFR, EST AFRICAN AMERICAN: 119 mL/min/{1.73_m2} (ref >59)
Globulin, Total: 2.4 g/dL (ref 1.5–4.5)
Glucose: 101 mg/dL — ABNORMAL HIGH (ref 65–99)
POTASSIUM: 4.6 mmol/L (ref 3.5–5.2)
Sodium: 141 mmol/L (ref 134–144)
Total Protein: 6.9 g/dL (ref 6.0–8.5)

## 2015-06-22 LAB — SEDIMENTATION RATE: SED RATE: 2 mm/h (ref 0–15)

## 2015-06-22 LAB — TSH: TSH: 1.4 u[IU]/mL (ref 0.450–4.500)

## 2015-06-22 LAB — LYME AB/WESTERN BLOT REFLEX
LYME DISEASE AB, QUANT, IGM: <0.8 index (ref 0.00–0.79)
Lyme IgG/IgM Ab: <0.91 {ISR} (ref 0.00–0.90)

## 2015-06-22 LAB — TESTOSTERONE,FREE AND TOTAL
TESTOSTERONE FREE: 9.1 pg/mL (ref 8.7–25.1)
TESTOSTERONE: 341 ng/dL — AB (ref 348–1197)

## 2015-06-24 NOTE — Progress Notes (Signed)
Patient aware.

## 2015-06-24 NOTE — Progress Notes (Signed)
lmtcb

## 2015-07-19 ENCOUNTER — Encounter: Payer: Self-pay | Admitting: Physician Assistant

## 2015-07-19 ENCOUNTER — Ambulatory Visit (INDEPENDENT_AMBULATORY_CARE_PROVIDER_SITE_OTHER): Payer: Medicare Other | Admitting: Physician Assistant

## 2015-07-19 VITALS — BP 130/77 | HR 77 | Temp 97.5°F | Ht 75.0 in | Wt 167.0 lb

## 2015-07-19 DIAGNOSIS — R350 Frequency of micturition: Secondary | ICD-10-CM

## 2015-07-19 DIAGNOSIS — L309 Dermatitis, unspecified: Secondary | ICD-10-CM | POA: Diagnosis not present

## 2015-07-19 DIAGNOSIS — R399 Unspecified symptoms and signs involving the genitourinary system: Secondary | ICD-10-CM | POA: Diagnosis not present

## 2015-07-19 LAB — POCT UA - MICROSCOPIC ONLY
Bacteria, U Microscopic: NEGATIVE
CASTS, UR, LPF, POC: NEGATIVE
Crystals, Ur, HPF, POC: NEGATIVE
Mucus, UA: NEGATIVE
RBC, URINE, MICROSCOPIC: NEGATIVE
WBC, Ur, HPF, POC: NEGATIVE
Yeast, UA: NEGATIVE

## 2015-07-19 LAB — POCT URINALYSIS DIPSTICK
BILIRUBIN UA: NEGATIVE
Blood, UA: NEGATIVE
GLUCOSE UA: NEGATIVE
Ketones, UA: NEGATIVE
Leukocytes, UA: NEGATIVE
Nitrite, UA: NEGATIVE
Protein, UA: NEGATIVE
SPEC GRAV UA: 1.01
Urobilinogen, UA: NEGATIVE
pH, UA: 7.5

## 2015-07-19 NOTE — Progress Notes (Signed)
   Subjective:    Patient ID: Clifford Rowe, male    DOB: 1986-05-18, 29 y.o.   MRN: [id number]  HPI 29 y/o male presents with c/o itching on penis and testicles in addition to feeling like he needs to urinated more frequently. Denies new sexual partners or anyone with similar symptoms. He is monogamous and states that as far as he knows his girlfriend is too. He has an appt with his urologist in 3 days for check up on varicocele.     Review of Systems  Constitutional: Negative.   HENT: Negative.   Eyes: Negative.   Respiratory: Negative.   Cardiovascular: Negative.   Gastrointestinal: Positive for nausea.  Endocrine: Positive for polyuria.  Genitourinary: Positive for urgency and frequency. Negative for dysuria, hematuria, discharge and difficulty urinating.       Penile and testicular itching   Musculoskeletal: Negative.   Skin: Negative.   Allergic/Immunologic: Negative.   Neurological: Negative.   Hematological: Negative.   Psychiatric/Behavioral: Negative.        Objective:   Physical Exam  Constitutional: He is oriented to person, place, and time. He appears well-developed and well-nourished. No distress.  Cardiovascular: Normal rate and regular rhythm.   Genitourinary: Penis normal. No penile tenderness.  Neurological: He is alert and oriented to person, place, and time.  Skin: No rash noted. He is not diaphoretic.  No rash or lesions noted on penis, testicular area   Psychiatric: He has a normal mood and affect. His behavior is normal. Judgment and thought content normal.  Nursing note and vitals reviewed.  Results for orders placed or performed in visit on 07/19/15  POCT urinalysis dipstick  Result Value Ref Range   Color, UA YELLOW    Clarity, UA CLEAR    Glucose, UA NEG    Bilirubin, UA NEG    Ketones, UA NEG    Spec Grav, UA 1.010    Blood, UA NEG    pH, UA 7.5    Protein, UA NEG    Urobilinogen, UA negative    Nitrite, UA NEG    Leukocytes, UA Negative  Negative  POCT UA - Microscopic Only  Result Value Ref Range   WBC, Ur, HPF, POC NEG    RBC, urine, microscopic NEG    Bacteria, U Microscopic NEG    Mucus, UA NEG    Epithelial cells, urine per micros OCC    Crystals, Ur, HPF, POC NEG    Casts, Ur, LPF, POC NEG    Yeast, UA NEG           Assessment & Plan:  1. UTI symptoms  - POCT urinalysis dipstick - POCT UA - Microscopic Only - Urine culture - Report symptoms to urologist on visit this week  2. Urinary frequency  - Urine culture  3. Dermatitis - Change to [employer]     Clifford Rowe A. Benjamin Stain PA-C

## 2015-07-21 LAB — URINE CULTURE

## 2015-07-22 DIAGNOSIS — I861 Scrotal varices: Secondary | ICD-10-CM | POA: Diagnosis not present

## 2015-07-22 DIAGNOSIS — N508 Other specified disorders of male genital organs: Secondary | ICD-10-CM | POA: Diagnosis not present

## 2015-07-22 DIAGNOSIS — Z87438 Personal history of other diseases of male genital organs: Secondary | ICD-10-CM | POA: Diagnosis not present

## 2015-07-22 DIAGNOSIS — N509 Disorder of male genital organs, unspecified: Secondary | ICD-10-CM | POA: Diagnosis not present

## 2015-07-22 DIAGNOSIS — Z9889 Other specified postprocedural states: Secondary | ICD-10-CM | POA: Diagnosis not present

## 2015-08-18 IMAGING — CR DG ABDOMEN 1V
1 series · 1 of 1 positions shown · non-contrast
Comparison: CT abdomen pelvis of 11/25/2011

CLINICAL DATA: Abdominal pain

EXAM:
ABDOMEN - 1 VIEW

[view not recorded]
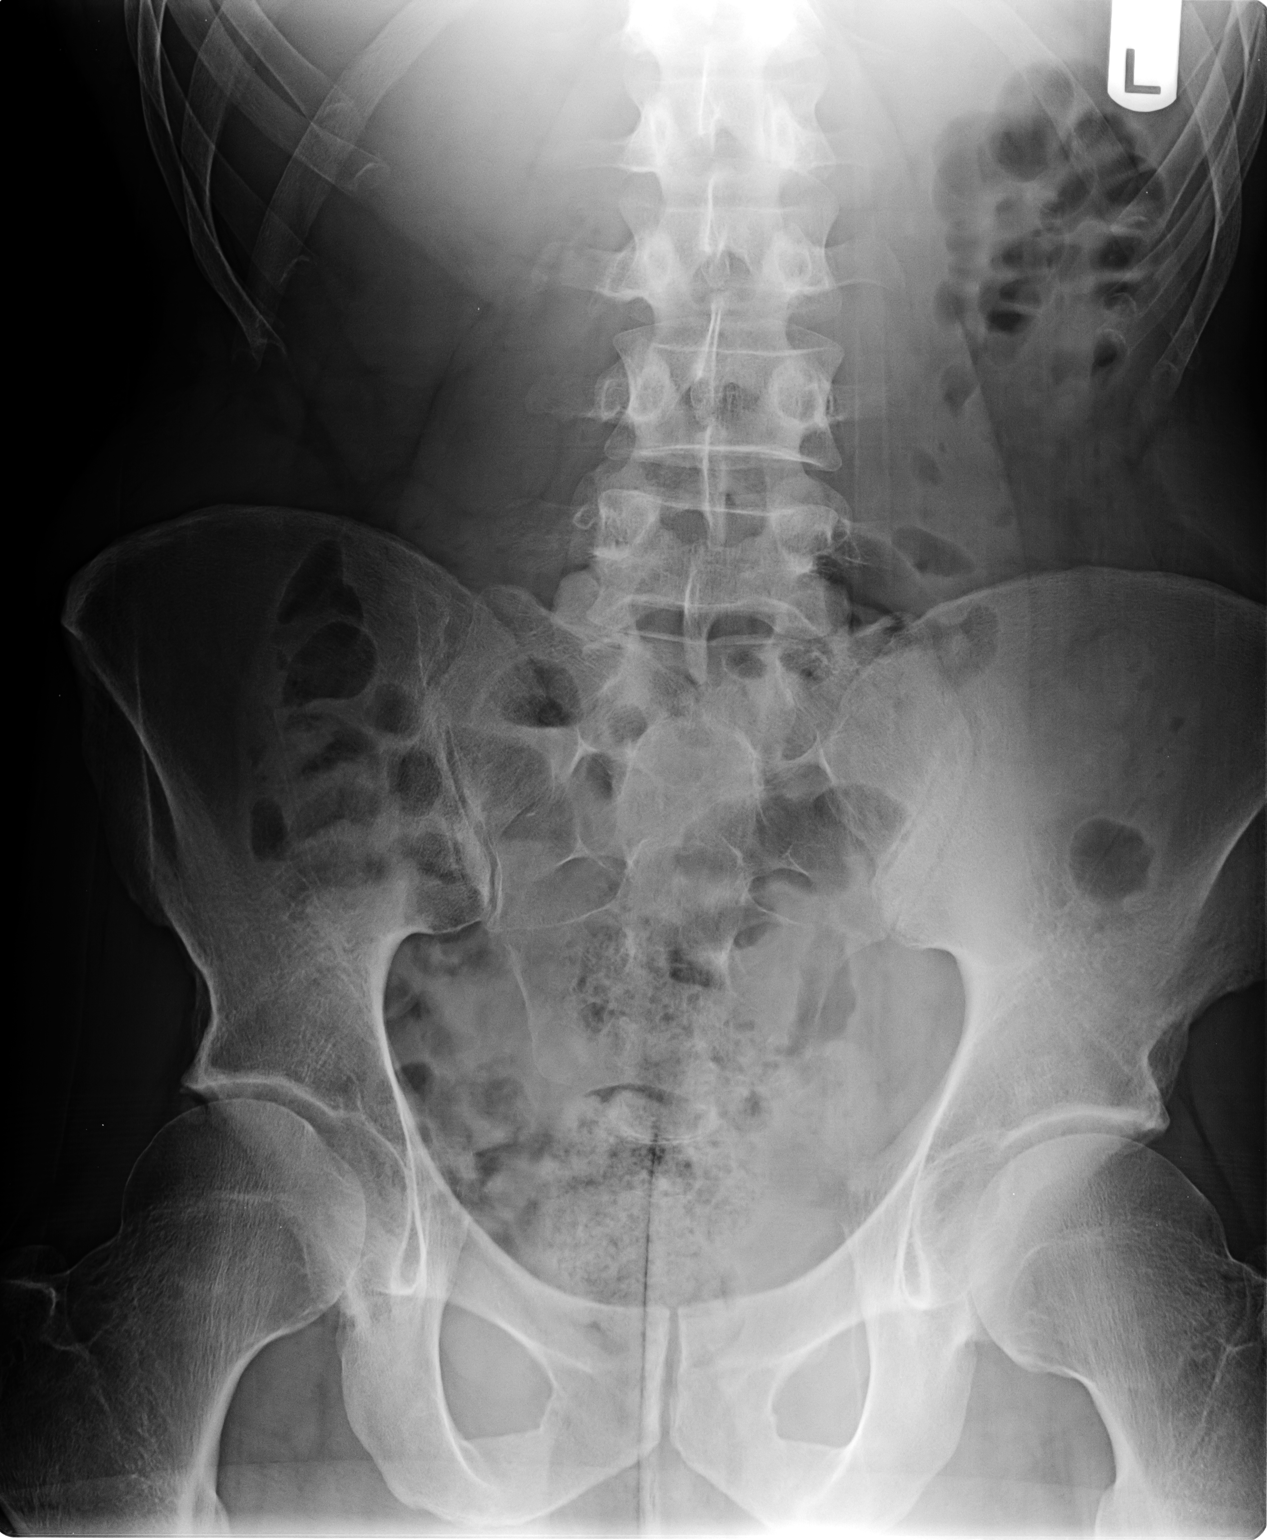

[1 of 1 positions shown; findings below may reference images not displayed]

FINDINGS: A supine film of the abdomen shows no bowel obstruction. Only a
small amount of feces is noted in the rectosigmoid colon. No opaque
calculi are seen. The bones are unremarkable.
IMPRESSION: No bowel obstruction.  No plain film evidence of constipation.

## 2015-09-07 ENCOUNTER — Encounter: Payer: Self-pay | Admitting: Family Medicine

## 2015-09-07 ENCOUNTER — Ambulatory Visit (INDEPENDENT_AMBULATORY_CARE_PROVIDER_SITE_OTHER): Payer: Medicare Other | Admitting: Family Medicine

## 2015-09-07 VITALS — BP 122/76 | HR 78 | Temp 97.8°F | Ht 75.0 in | Wt 169.8 lb

## 2015-09-07 DIAGNOSIS — M65851 Other synovitis and tenosynovitis, right thigh: Secondary | ICD-10-CM

## 2015-09-07 DIAGNOSIS — M76891 Other specified enthesopathies of right lower limb, excluding foot: Secondary | ICD-10-CM

## 2015-09-07 MED ORDER — DICLOFENAC SODIUM 75 MG PO TBEC: 75.0000 mg | DELAYED_RELEASE_TABLET | Freq: Two times a day (BID) | ORAL | Status: DC

## 2015-09-07 NOTE — Progress Notes (Signed)
Subjective:  Patient ID: Clifford Rowe, male    DOB: [birth year]  Age: 29 y.o. MRN: [id number]  CC: R leg pain   HPI Clifford Rowe presents for 2 weeks pain behind right knee. NKI. The pain is moderate in intensity. It has been stable but not improving. It is worse when he lays down and better when he moves around. There are no other joints involved.  History [name] has a past medical history of GERD (gastroesophageal reflux disease).   He has past surgical history that includes Cystectomy and Surgery scrotal / testicular.   His family history includes Cancer in his father.He reports that he has never smoked. He does not have any smokeless tobacco history on file. He reports that he does not drink alcohol or use illicit drugs.  Outpatient Prescriptions Prior to Visit  Medication Sig Dispense Refill  . pantoprazole (PROTONIX) 40 MG tablet TAKE ONE TABLET BY MOUTH TWICE DAILY 60 tablet 2   No facility-administered medications prior to visit.    ROS Review of Systems  Constitutional: Negative for fever, chills and diaphoresis.  HENT: Negative for congestion and sore throat.   Respiratory: Negative for cough and shortness of breath.   Gastrointestinal: Negative for abdominal pain.  Genitourinary: Negative for dysuria and frequency.  Musculoskeletal: Positive for arthralgias. Negative for myalgias, back pain and joint swelling.  Skin: Negative for rash.    Objective:  BP 122/76 mmHg  Pulse 78  Temp(Src) 97.8 F (36.6 C) (Oral)  Ht 6\' 3"  (1.905 m)  Wt 169 lb 12.8 oz (77.021 kg)  BMI 21.22 kg/m2  BP Readings from Last 3 Encounters:  09/07/15 122/76  07/19/15 130/77  06/21/15 135/80    Wt Readings from Last 3 Encounters:  09/07/15 169 lb 12.8 oz (77.021 kg)  07/19/15 167 lb (75.751 kg)  06/21/15 169 lb (76.658 kg)     Physical Exam  Constitutional: He is oriented to person, place, and time. He appears well-developed and well-nourished.  HENT:  Head: Normocephalic and  atraumatic.  Right Ear: External ear normal.  Left Ear: External ear normal.  Mouth/Throat: No oropharyngeal exudate or posterior oropharyngeal erythema.  Eyes: Pupils are equal, round, and reactive to light.  Neck: Normal range of motion. Neck supple.  Cardiovascular: Normal rate and regular rhythm.   No murmur heard. Pulmonary/Chest: Breath sounds normal. No respiratory distress.  Abdominal: Bowel sounds are normal.  Musculoskeletal: Normal range of motion. He exhibits no edema. Tenderness: mild tenderness at the popliteal fossa laterally and distally at the right knee. There is a negative McMurray sign. Negative Lachman sign. Minimal discomfort with collateral stress maneuvers.  Neurological: He is alert and oriented to person, place, and time.  Skin: Skin is warm and dry.  Vitals reviewed.   No results found for: HGBA1C  Lab Results  Component Value Date   WBC 6.1 06/21/2015   HGB 16.0 06/21/2015   HCT 50.0 06/21/2015   PLT 192 10/05/2012   GLUCOSE 101* 06/21/2015   ALT 14 06/21/2015   AST 13 06/21/2015   NA 141 06/21/2015   K 4.6 06/21/2015   CL 102 06/21/2015   CREATININE 0.98 06/21/2015   BUN 12 06/21/2015   CO2 24 06/21/2015   TSH 1.400 06/21/2015    US Scrotum  01/26/2015   CLINICAL DATA:  Left testicular pain. 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  EXAM: SCROTAL ULTRASOUND  DOPPLER ULTRASOUND OF THE TESTICLES  TECHNIQUE: Complete ultrasound examination of the testicles, epididymis, and other scrotal  structures was performed. Color and spectral Doppler ultrasound were also utilized to evaluate blood flow to the testicles.  COMPARISON:  None.  FINDINGS: Right testicle  Measurements: 3.2 x 1.4 x 2.4 cm. The testicle is asymmetrically hypovascular and heterogeneous in echotexture. There is mild bulging of the tunica, but no focal mass lesions identified.  Left testicle  Measurements: 4.1 x 2.2 x 3 cm. No mass or microlithiasis visualized.  Right epididymis:  Normal  in size and appearance.  Left epididymis:  Normal in size and appearance.  Hydrocele:  None visualized.  Varicocele: Present on the left, with veins dilating to 6 mm during Valsalva.  Pulsed Doppler interrogation of both testes demonstrates low resistance arterial and venous waveforms bilaterally.  IMPRESSION: 1. No acute findings, including torsion. 2. Left varicocele. 3. 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 Please ensure the patient is under the surveillance of a urologist.   Electronically Signed   By: Jorje Guild M.D.   On: 01/26/2015 12:28   [place] Art/ven Flow Abd Pelv Doppler  01/26/2015   CLINICAL DATA:  Left testicular pain. 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  EXAM: SCROTAL ULTRASOUND  DOPPLER ULTRASOUND OF THE TESTICLES  TECHNIQUE: Complete ultrasound examination of the testicles, epididymis, and other scrotal structures was performed. Color and spectral Doppler ultrasound were also utilized to evaluate blood flow to the testicles.  COMPARISON:  None.  FINDINGS: Right testicle  Measurements: 3.2 x 1.4 x 2.4 cm. The testicle is asymmetrically hypovascular and heterogeneous in echotexture. There is mild bulging of the tunica, but no focal mass lesions identified.  Left testicle  Measurements: 4.1 x 2.2 x 3 cm. No mass or microlithiasis visualized.  Right epididymis:  Normal in size and appearance.  Left epididymis:  Normal in size and appearance.  Hydrocele:  None visualized.  Varicocele: Present on the left, with veins dilating to 6 mm during Valsalva.  Pulsed Doppler interrogation of both testes demonstrates low resistance arterial and venous waveforms bilaterally.  IMPRESSION: 1. No acute findings, including torsion. 2. Left varicocele. 3. 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 Please ensure the patient is under the surveillance of a urologist.   Electronically Signed   By:  Jorje Guild M.D.   On: 01/26/2015 12:28    Assessment & Plan:   [name] was seen today for r leg pain.  Diagnoses and all orders for this visit:  Hamstring tendonitis of right thigh  Other orders -     diclofenac (VOLTAREN) 75 MG EC tablet; Take 1 tablet (75 mg total) by mouth 2 (two) times daily.   I am having Mr. Larouche start on diclofenac. I am also having him maintain his pantoprazole.  Meds ordered this encounter  Medications  . diclofenac (VOLTAREN) 75 MG EC tablet    Sig: Take 1 tablet (75 mg total) by mouth 2 (two) times daily.    Dispense:  60 tablet    Refill:  2   Apply heat as needed  Follow-up: Return in about 2 days (around 09/09/2015) for CPE with Dr. Wendi Snipes.  Claretta Fraise, M.D.

## 2015-09-09 ENCOUNTER — Encounter: Payer: Self-pay | Admitting: Family Medicine

## 2015-09-09 ENCOUNTER — Ambulatory Visit (INDEPENDENT_AMBULATORY_CARE_PROVIDER_SITE_OTHER): Payer: Medicare Other | Admitting: Family Medicine

## 2015-09-09 VITALS — BP 126/74 | HR 68 | Temp 97.6°F | Ht 75.0 in | Wt 168.4 lb

## 2015-09-09 DIAGNOSIS — Z0000 Encounter for general adult medical examination without abnormal findings: Secondary | ICD-10-CM | POA: Diagnosis not present

## 2015-09-09 NOTE — Patient Instructions (Signed)
Come back as needed, otherwise once a year.

## 2015-09-09 NOTE — Progress Notes (Signed)
Subjective:    Clifford Rowe is a 29 y/o male  who presents for Medicare Annual/Subsequent preventive examination.  Preventive Screening-Counseling & Management  Tobacco Non smoker  Problems Prior to Visit 1. Gerd 2.  Learning/cognitive disability   Current Problems (verified) There are no active problems to display for this patient.   Medications Prior to Visit Current Outpatient Prescriptions on File Prior to Visit  Medication Sig Dispense Refill  . diclofenac (VOLTAREN) 75 MG EC tablet Take 1 tablet (75 mg total) by mouth 2 (two) times daily. 60 tablet 2  . pantoprazole (PROTONIX) 40 MG tablet TAKE ONE TABLET BY MOUTH TWICE DAILY 60 tablet 2   No current facility-administered medications on file prior to visit.       Current Medications (verified)  Current outpatient prescriptions:  .  diclofenac (VOLTAREN) 75 MG EC tablet, Take 1 tablet (75 mg total) by mouth 2 (two) times daily., Disp: 60 tablet, Rfl: 2 .  pantoprazole (PROTONIX) 40 MG tablet, TAKE ONE TABLET BY MOUTH TWICE DAILY, Disp: 60 tablet, Rfl: 2  Allergies (verified) NKDA  PAST HISTORY Family History  Problem Relation Age of Onset  . Cancer Father     lung   Past Surgical History  Procedure Laterality Date  . Cystectomy    . Surgery scrotal / testicular     Past Medical History  Diagnosis Date  . GERD (gastroesophageal reflux disease)    Are there smokers in your home (other than you)? No  Risk Factors Current exercise habits: walks daily 1-2 hours daily  Dietary issues discussed: No diet concerns, no limitations  Cardiac risk factors: None  Depression Screen (Note: if answer to either of the following is "Yes", a more complete depression screening is indicated)   Over the past two weeks, have you felt down, depressed or hopeless? No  Over the past two weeks, have you felt little interest or pleasure in doing things? No  Have you lost interest or pleasure in daily life? No  Do you often  feel hopeless? No  Do you cry easily over simple problems? No  Activities of Daily Living In your present state of health, do you have any difficulty performing the following activities?:  Driving? No Managing money?  No Feeding yourself? No Getting from bed to chair? No Climbing a flight of stairs? No Preparing food and eating?: No Bathing or showering? No Getting dressed: No Getting to the toilet? No Using the toilet:No Moving around from place to place: No In the past year have you fallen or had a near fall?:No   Are you sexually active?  yes  Do you have more than one partner?  No  Hearing Difficulties: No Do you often ask people to speak up or repeat themselves? No Do you experience ringing or noises in your ears? No Do you have difficulty understanding soft or whispered voices? No   Do you feel that you have a problem with memory? No  Do you often misplace items? No  Do you feel safe at home?  Yes  Cognitive Testing  Alert? Yes  Normal Appearance?Yes  Oriented to person? Yes  Place? Yes   Time? Yes  Recall of three objects?  Yes  Can perform simple calculations? Yes  Displays appropriate judgment?Yes  Can read the correct time from a watch face?Yes   Advanced Directives have been discussed with the patient? Yes  List the Names of Other Physician/Practitioners you currently use: 1.  Urology at [facility]  Indicate any recent Medical Services you may have received from other than [facility] providers in the past year (date may be approximate).  Immunization status: up to date and documented.            Screening Tests None   Review of Systems Pertinent items are noted in HPI.    Objective:       Filed Vitals:   09/09/15 0850  BP: 126/74  Pulse: 68  Temp: 97.6 F (36.4 C)   Gen: NAD, alert, cooperative with exam HEENT: NCAT, TMS WNL BL, oropharynx clear, some heme crusting in L ear canal CV: RRR, good S1/S2, no murmur Resp: CTABL, no wheezes,  non-labored Ext: No edema, warm Neuro: Alert and oriented, No gross deficits       Assessment:     Mr. Steward is a pleasant 29 y/o male hear for a medicare annual visit. 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 He has  A current hamstring tendonitis currently being treated.   He is not due ofr any immunizations or screens.  We discussed a flu shot this season and advanced directives.      Plan:     During the course of the visit the patient was educated and counseled about appropriate screening and preventive services including:    Flu shot later this season  Advanced directives discussed  Diet review for nutrition referral? no   Patient Instructions (the written plan) was given to the patient.  Medicare Attestation I have personally reviewed: The patient's medical and social history Their use of alcohol, tobacco or illicit drugs Their current medications and supplements The patient's functional ability including ADLs,fall risks, home safety risks, cognitive, and hearing and visual impairment Diet and physical activities Evidence for depression or mood disorders  The patient's weight, height, BMI, and visual acuity have been recorded in the chart.  I have made referrals, counseling, and provided education to the patient based on review of the above and I have provided the patient with a written personalized care plan for preventive services.     Kenn File, MD   09/09/2015

## 2015-09-11 ENCOUNTER — Ambulatory Visit (INDEPENDENT_AMBULATORY_CARE_PROVIDER_SITE_OTHER): Payer: Medicare Other | Admitting: Family

## 2015-09-11 ENCOUNTER — Encounter: Payer: Self-pay | Admitting: Family

## 2015-09-11 VITALS — BP 131/82 | HR 61 | Temp 97.6°F | Ht 75.0 in | Wt 169.0 lb

## 2015-09-11 DIAGNOSIS — R1032 Left lower quadrant pain: Secondary | ICD-10-CM | POA: Diagnosis not present

## 2015-09-11 LAB — POCT URINALYSIS DIPSTICK
Bilirubin, UA: NEGATIVE
Blood, UA: NEGATIVE
Glucose, UA: NEGATIVE
Ketones, UA: NEGATIVE
LEUKOCYTES UA: NEGATIVE
NITRITE UA: NEGATIVE
PH UA: 8.5
Protein, UA: NEGATIVE
Spec Grav, UA: <=1.005
UROBILINOGEN UA: NEGATIVE

## 2015-09-11 LAB — POCT UA - MICROSCOPIC ONLY
Bacteria, U Microscopic: NEGATIVE
CASTS, UR, LPF, POC: NEGATIVE
Crystals, Ur, HPF, POC: NEGATIVE
RBC, urine, microscopic: NEGATIVE
YEAST UA: NEGATIVE

## 2015-09-11 NOTE — Progress Notes (Signed)
Subjective:    Patient ID: Clifford Rowe, male    DOB: 10-Feb-1986, 29 y.o.   MRN: [id number]  Pt is followed by an Urologists Abdominal Pain This is a new problem. The current episode started yesterday. The onset quality is sudden. The problem occurs constantly. The problem has been unchanged. The pain is located in the LLQ. The pain is at a severity of 5/10. The pain is mild. The quality of the pain is burning. Pain radiation: left groin. Pertinent negatives include no constipation, dysuria, fever, frequency, hematuria, myalgias, nausea or vomiting. Nothing aggravates the pain. The pain is relieved by nothing. He has tried acetaminophen for the symptoms. The treatment provided no relief.      Review of Systems  Constitutional: Negative.  Negative for fever.  HENT: Negative.   Respiratory: Negative.   Cardiovascular: Negative.   Gastrointestinal: Positive for abdominal pain. Negative for nausea, vomiting and constipation.  Endocrine: Negative.   Genitourinary: Negative.  Negative for dysuria, frequency and hematuria.  Musculoskeletal: Negative.  Negative for myalgias.  Neurological: Negative.   Hematological: Negative.   Psychiatric/Behavioral: Negative.   All other systems reviewed and are negative.      Objective:   Physical Exam  Constitutional: He is oriented to person, place, and time. He appears well-developed and well-nourished. No distress.  HENT:  Head: Normocephalic.  Right Ear: External ear normal.  Left Ear: External ear normal.  Mouth/Throat: Oropharynx is clear and moist.  Eyes: Pupils are equal, round, and reactive to light. Right eye exhibits no discharge. Left eye exhibits no discharge.  Neck: Normal range of motion. Neck supple. No thyromegaly present.  Cardiovascular: Normal rate, regular rhythm, normal heart sounds and intact distal pulses.   No murmur heard. Pulmonary/Chest: Effort normal and breath sounds normal. No respiratory distress. He has no wheezes.   Abdominal: Soft. Bowel sounds are normal. He exhibits no distension. There is no tenderness. Hernia confirmed negative in the left inguinal area.  Genitourinary: Rectum normal, prostate normal and penis normal. Right testis shows no mass, no swelling and no tenderness. Right testis is descended. Left testis shows no mass, no swelling and no tenderness. Left testis is descended. No penile tenderness.  Musculoskeletal: Normal range of motion. He exhibits no edema or tenderness.  Neurological: He is alert and oriented to person, place, and time. He has normal reflexes. No cranial nerve deficit.  Skin: Skin is warm and dry. No rash noted. No erythema.  Psychiatric: He has a normal mood and affect. His behavior is normal. Judgment and thought content normal.  Vitals reviewed.   Results for orders placed or performed in visit on 09/11/15  POCT urinalysis dipstick  Result Value Ref Range   Color, UA gold    Clarity, UA clear    Glucose, UA negative    Bilirubin, UA negative    Ketones, UA negative    Spec Grav, UA <=1.005    Blood, UA negative]    pH, UA 8.5    Protein, UA negative    Urobilinogen, UA negative    Nitrite, UA negative    Leukocytes, UA Negative Negative  POCT UA - Microscopic Only  Result Value Ref Range   WBC, Ur, HPF, POC rare    RBC, urine, microscopic negative    Bacteria, U Microscopic negative    Mucus, UA occ    Epithelial cells, urine per micros occ    Crystals, Ur, HPF, POC negative    Casts, Ur, LPF, POC negative  Yeast, UA negative      BP 131/82 mmHg  Pulse 61  Temp(Src) 97.6 F (36.4 C) (Oral)  Ht 6\' 3"  (1.905 m)  Wt 169 lb (76.658 kg)  BMI 21.12 kg/m2     Assessment & Plan:  1. Abdominal pain, left lower quadrant -Rest -Tylenol or motrin prn for pain -Avoid heavy lifting -If pain does not improve RTO - POCT urinalysis dipstick - POCT UA - Microscopic Only  Evelina Dun, FNP

## 2015-09-11 NOTE — Patient Instructions (Signed)

## 2015-09-17 ENCOUNTER — Other Ambulatory Visit: Payer: Self-pay | Admitting: Nurse Practitioner

## 2015-10-15 ENCOUNTER — Telehealth: Payer: Self-pay | Admitting: Family Medicine

## 2015-10-15 NOTE — Telephone Encounter (Signed)
Does not want a flu shot

## 2015-11-29 ENCOUNTER — Encounter: Payer: Self-pay | Admitting: Family

## 2015-11-29 ENCOUNTER — Ambulatory Visit (INDEPENDENT_AMBULATORY_CARE_PROVIDER_SITE_OTHER): Payer: Medicare Other | Admitting: Family

## 2015-11-29 VITALS — BP 135/81 | HR 70 | Temp 98.0°F | Ht 75.0 in | Wt 169.6 lb

## 2015-11-29 DIAGNOSIS — R1011 Right upper quadrant pain: Secondary | ICD-10-CM

## 2015-11-29 DIAGNOSIS — R531 Weakness: Secondary | ICD-10-CM | POA: Diagnosis not present

## 2015-11-29 DIAGNOSIS — R5383 Other fatigue: Secondary | ICD-10-CM | POA: Diagnosis not present

## 2015-11-29 NOTE — Progress Notes (Signed)
Subjective:    Patient ID: Clifford Rowe, male    DOB: Sep 23, 1986, 29 y.o.   MRN: [id number]  Pt presents to the office today with complaints of fatigue, weakness, and RUQ abd pain that started 4-5 days ago. Pt states these symptoms are unchanged. Pt denies any nausea, vomiting, diarrhea, constipation, or fever.  Abdominal Pain This is a new problem. The current episode started in the past 7 days. The onset quality is sudden. The problem occurs intermittently. The problem has been unchanged. The pain is located in the RUQ. The pain is at a severity of 3/10. The pain is mild. The quality of the pain is burning. The abdominal pain does not radiate. Associated symptoms include myalgias. Pertinent negatives include no arthralgias, belching, constipation, diarrhea, dysuria, fever, frequency, headaches, hematuria, nausea or vomiting. Nothing aggravates the pain. The pain is relieved by certain positions. He has tried proton pump inhibitors for the symptoms. The treatment provided no relief. His past medical history is significant for GERD.      Review of Systems  Constitutional: Positive for fatigue. Negative for fever.  HENT: Negative.   Respiratory: Negative.   Cardiovascular: Negative.   Gastrointestinal: Positive for abdominal pain. Negative for nausea, vomiting, diarrhea and constipation.  Endocrine: Negative.   Genitourinary: Negative.  Negative for dysuria, frequency and hematuria.  Musculoskeletal: Positive for myalgias. Negative for arthralgias.  Neurological: Positive for weakness and light-headedness. Negative for headaches.  Hematological: Negative.   Psychiatric/Behavioral: Negative.   All other systems reviewed and are negative.      Objective:   Physical Exam  Constitutional: He is oriented to person, place, and time. He appears well-developed and well-nourished. No distress.  HENT:  Head: Normocephalic.  Right Ear: External ear normal.  Left Ear: External ear normal.    Nose: Nose normal.  Mouth/Throat: Oropharynx is clear and moist.  Eyes: Pupils are equal, round, and reactive to light. Right eye exhibits no discharge. Left eye exhibits no discharge.  Neck: Normal range of motion. Neck supple. No thyromegaly present.  Cardiovascular: Normal rate, regular rhythm, normal heart sounds and intact distal pulses.   No murmur heard. Pulmonary/Chest: Effort normal and breath sounds normal. No respiratory distress. He has no wheezes.  Abdominal: Soft. Bowel sounds are normal. He exhibits no distension. There is tenderness (mild RUQ tenderness).  Musculoskeletal: Normal range of motion. He exhibits no edema or tenderness.  Neurological: He is alert and oriented to person, place, and time. He has normal reflexes. No cranial nerve deficit.  Skin: Skin is warm and dry. No rash noted. No erythema.  Psychiatric: He has a normal mood and affect. His behavior is normal. Judgment and thought content normal.  Vitals reviewed.   BP 135/81 mmHg  Pulse 70  Temp(Src) 98 F (36.7 C) (Oral)  Ht _0  (1.905 m)  Wt 169 lb 9.6 oz (76.93 kg)  BMI 21.20 kg/m2       Assessment & Plan:  1. RUQ abdominal pain - Anemia Profile B - CMP14+EGFR - Thyroid Panel With TSH - VITAMIN D 25 Hydroxy (Vit-D Deficiency, Fractures)  2. Other fatigue - Anemia Profile B - CMP14+EGFR - Thyroid Panel With TSH - VITAMIN D 25 Hydroxy (Vit-D Deficiency, Fractures)  3. Weakness - Anemia Profile B - CMP14+EGFR - Thyroid Panel With TSH - VITAMIN D 25 Hydroxy (Vit-D Deficiency, Fractures)  Labs pending RTO in 2 weeks if not improved If lab work comes back negative this could be related to viral syndrome? Force fluids  Bland diet Encouraged healthy diet and exercise   Evelina Dun, FNP

## 2015-11-29 NOTE — Patient Instructions (Signed)
Abdominal Pain, Adult Many things can cause abdominal pain. Usually, abdominal pain is not caused by a disease and will improve without treatment. It can often be observed and treated at home. Your health care provider will do a physical exam and possibly order blood tests and X-rays to help determine the seriousness of your pain. However, in many cases, more time must pass before a clear cause of the pain can be found. Before that point, your health care provider may not know if you need more testing or further treatment. HOME CARE INSTRUCTIONS Monitor your abdominal pain for any changes. The following actions may help to alleviate any discomfort you are experiencing:  Only take over-the-counter or prescription medicines as directed by your health care provider.  Do not take laxatives unless directed to do so by your health care provider.  Try a clear liquid diet (broth, tea, or water) as directed by your health care provider. Slowly move to a bland diet as tolerated. SEEK MEDICAL CARE IF:  You have unexplained abdominal pain.  You have abdominal pain associated with nausea or diarrhea.  You have pain when you urinate or have a bowel movement.  You experience abdominal pain that wakes you in the night.  You have abdominal pain that is worsened or improved by eating food.  You have abdominal pain that is worsened with eating fatty foods.  You have a fever. SEEK IMMEDIATE MEDICAL CARE IF:  Your pain does not go away within 2 hours.  You keep throwing up (vomiting).  Your pain is felt only in portions of the abdomen, such as the right side or the left lower portion of the abdomen.  You pass bloody or black tarry stools. MAKE SURE YOU:  Understand these instructions.  Will watch your condition.  Will get help right away if you are not doing well or get worse.   This information is not intended to replace advice given to you by your health care provider. Make sure you discuss  any questions you have with your health care provider.   Document Released: 09/20/2005 Document Revised: 09/01/2015 Document Reviewed: 08/20/2013 Elsevier Interactive Patient Education 2016 [employer]. Fatigue Fatigue is feeling tired all of the time, a lack of energy, or a lack of motivation. Occasional or mild fatigue is often a normal response to activity or life in general. However, long-lasting (chronic) or extreme fatigue may indicate an underlying medical condition. HOME CARE INSTRUCTIONS  Watch your fatigue for any changes. The following actions may help to lessen any discomfort you are feeling:  Talk to your health care provider about how much sleep you need each night. Try to get the required amount every night.  Take medicines only as directed by your health care provider.  Eat a healthy and nutritious diet. Ask your health care provider if you need help changing your diet.  Drink enough fluid to keep your urine clear or pale yellow.  Practice ways of relaxing, such as yoga, meditation, massage therapy, or acupuncture.  Exercise regularly.   Change situations that cause you stress. Try to keep your work and personal routine reasonable.  Do not abuse illegal drugs.  Limit alcohol intake to no more than 1 drink per day for nonpregnant women and 2 drinks per day for men. One drink equals 12 ounces of beer, 5 ounces of wine, or 1 ounces of hard liquor.  Take a multivitamin, if directed by your health care provider. SEEK MEDICAL CARE IF:   Your fatigue  does not get better.  You have a fever.   You have unintentional weight loss or gain.  You have headaches.   You have difficulty:   Falling asleep.  Sleeping throughout the night.  You feel angry, guilty, anxious, or sad.   You are unable to have a bowel movement (constipation).   You skin is dry.   Your legs or another part of your body is swollen.  SEEK IMMEDIATE MEDICAL CARE IF:   You feel  confused.   Your vision is blurry.  You feel faint or pass out.   You have a severe headache.   You have severe abdominal, pelvic, or back pain.   You have chest pain, shortness of breath, or an irregular or fast heartbeat.   You are unable to urinate or you urinate less than normal.   You develop abnormal bleeding, such as bleeding from the rectum, vagina, nose, lungs, or nipples.  You vomit blood.   You have thoughts about harming yourself or committing suicide.   You are worried that you might harm someone else.    This information is not intended to replace advice given to you by your health care provider. Make sure you discuss any questions you have with your health care provider.   Document Released: 10/08/2007 Document Revised: 01/01/2015 Document Reviewed: 04/14/2014 Elsevier Interactive Patient Education [employer].

## 2015-11-30 LAB — THYROID PANEL WITH TSH
Free Thyroxine Index: 2.2 (ref 1.2–4.9)
T3 UPTAKE RATIO: 27 % (ref 24–39)
T4, Total: 8.1 ug/dL (ref 4.5–12.0)
TSH: 1.37 u[IU]/mL (ref 0.450–4.500)

## 2015-11-30 LAB — ANEMIA PROFILE B
BASOS: 1 %
Basophils Absolute: 0 10*3/uL (ref 0.0–0.2)
EOS (ABSOLUTE): 0.2 10*3/uL (ref 0.0–0.4)
EOS: 3 %
FERRITIN: 252 ng/mL (ref 30–400)
Folate: 12.1 ng/mL (ref >3.0)
HEMATOCRIT: 48.6 % (ref 37.5–51.0)
HEMOGLOBIN: 16.6 g/dL (ref 12.6–17.7)
IMMATURE GRANS (ABS): 0 10*3/uL (ref 0.0–0.1)
IRON SATURATION: 37 % (ref 15–55)
Immature Granulocytes: 0 %
Iron: 117 ug/dL (ref 38–169)
LYMPHS: 23 %
Lymphocytes Absolute: 1.8 10*3/uL (ref 0.7–3.1)
MCH: 30 pg (ref 26.6–33.0)
MCHC: 34.2 g/dL (ref 31.5–35.7)
MCV: 88 fL (ref 79–97)
MONOCYTES: 8 %
Monocytes Absolute: 0.6 10*3/uL (ref 0.1–0.9)
NEUTROS ABS: 5 10*3/uL (ref 1.4–7.0)
Neutrophils: 65 %
Platelets: 188 10*3/uL (ref 150–379)
RBC: 5.54 x10E6/uL (ref 4.14–5.80)
RDW: 14.4 % (ref 12.3–15.4)
RETIC CT PCT: 0.8 % (ref 0.6–2.6)
Total Iron Binding Capacity: 316 ug/dL (ref 250–450)
UIBC: 199 ug/dL (ref 111–343)
VITAMIN B 12: 325 pg/mL (ref 211–946)
WBC: 7.7 10*3/uL (ref 3.4–10.8)

## 2015-11-30 LAB — CMP14+EGFR
A/G RATIO: 1.8 (ref 1.1–2.5)
ALBUMIN: 4.8 g/dL (ref 3.5–5.5)
ALT: 11 IU/L (ref 0–44)
AST: 16 IU/L (ref 0–40)
Alkaline Phosphatase: 74 IU/L (ref 39–117)
BUN / CREAT RATIO: 13 (ref 8–19)
BUN: 11 mg/dL (ref 6–20)
Bilirubin Total: 1.4 mg/dL — ABNORMAL HIGH (ref 0.0–1.2)
CALCIUM: 9.9 mg/dL (ref 8.7–10.2)
CO2: 22 mmol/L (ref 18–29)
Chloride: 104 mmol/L (ref 97–106)
Creatinine, Ser: 0.83 mg/dL (ref 0.76–1.27)
GFR, EST AFRICAN AMERICAN: 136 mL/min/{1.73_m2} (ref >59)
GFR, EST NON AFRICAN AMERICAN: 117 mL/min/{1.73_m2} (ref >59)
GLOBULIN, TOTAL: 2.6 g/dL (ref 1.5–4.5)
Glucose: 95 mg/dL (ref 65–99)
POTASSIUM: 4.5 mmol/L (ref 3.5–5.2)
SODIUM: 146 mmol/L — AB (ref 136–144)
TOTAL PROTEIN: 7.4 g/dL (ref 6.0–8.5)

## 2015-11-30 LAB — VITAMIN D 25 HYDROXY (VIT D DEFICIENCY, FRACTURES): Vit D, 25-Hydroxy: 33.7 ng/mL (ref 30.0–100.0)

## 2015-12-02 ENCOUNTER — Other Ambulatory Visit: Payer: Self-pay | Admitting: Family Medicine

## 2015-12-21 ENCOUNTER — Encounter: Payer: Self-pay | Admitting: Physician Assistant

## 2015-12-21 ENCOUNTER — Ambulatory Visit (INDEPENDENT_AMBULATORY_CARE_PROVIDER_SITE_OTHER): Payer: Medicare Other | Admitting: Physician Assistant

## 2015-12-21 VITALS — BP 131/78 | HR 84 | Temp 98.9°F | Ht 75.0 in | Wt 169.6 lb

## 2015-12-21 DIAGNOSIS — J069 Acute upper respiratory infection, unspecified: Secondary | ICD-10-CM

## 2015-12-21 NOTE — Patient Instructions (Signed)
Upper Respiratory Infection, Adult Most upper respiratory infections (URIs) are a viral infection of the air passages leading to the lungs. A URI affects the nose, throat, and upper air passages. The most common type of URI is nasopharyngitis and is typically referred to as "the common cold." URIs run their course and usually go away on their own. Most of the time, a URI does not require medical attention, but sometimes a bacterial infection in the upper airways can follow a viral infection. This is called a secondary infection. Sinus and middle ear infections are common types of secondary upper respiratory infections. Bacterial pneumonia can also complicate a URI. A URI can worsen asthma and chronic obstructive pulmonary disease (COPD). Sometimes, these complications can require emergency medical care and may be life threatening.  CAUSES Almost all URIs are caused by viruses. A virus is a type of germ and can spread from one person to another.  RISKS FACTORS You may be at risk for a URI if:   You smoke.   You have chronic heart or lung disease.  You have a weakened defense (immune) system.   You are very young or very old.   You have nasal allergies or asthma.  You work in crowded or poorly ventilated areas.  You work in health care facilities or schools. SIGNS AND SYMPTOMS  Symptoms typically develop 2-3 days after you come in contact with a cold virus. Most viral URIs last 7-10 days. However, viral URIs from the influenza virus (flu virus) can last 14-18 days and are typically more severe. Symptoms may include:   Runny or stuffy (congested) nose.   Sneezing.   Cough.   Sore throat.   Headache.   Fatigue.   Fever.   Loss of appetite.   Pain in your forehead, behind your eyes, and over your cheekbones (sinus pain).  Muscle aches.  DIAGNOSIS  Your health care provider may diagnose a URI by:  Physical exam.  Tests to check that your symptoms are not due to  another condition such as:  Strep throat.  Sinusitis.  Pneumonia.  Asthma. TREATMENT  A URI goes away on its own with time. It cannot be cured with medicines, but medicines may be prescribed or recommended to relieve symptoms. Medicines may help:  Reduce your fever.  Reduce your cough.  Relieve nasal congestion. HOME CARE INSTRUCTIONS   Take medicines only as directed by your health care provider.   Gargle warm saltwater or take cough drops to comfort your throat as directed by your health care provider.  Use a warm mist humidifier or inhale steam from a shower to increase air moisture. This may make it easier to breathe.  Drink enough fluid to keep your urine clear or pale yellow.   Eat soups and other clear broths and maintain good nutrition.   Rest as needed.   Return to work when your temperature has returned to normal or as your health care provider advises. You may need to stay home longer to avoid infecting others. You can also use a face mask and careful hand washing to prevent spread of the virus.  Increase the usage of your inhaler if you have asthma.   Do not use any tobacco products, including cigarettes, chewing tobacco, or electronic cigarettes. If you need help quitting, ask your health care provider. PREVENTION  The best way to protect yourself from getting a cold is to practice good hygiene.   Avoid oral or hand contact with people with cold   symptoms.   Wash your hands often if contact occurs.  There is no clear evidence that vitamin C, vitamin E, echinacea, or exercise reduces the chance of developing a cold. However, it is always recommended to get plenty of rest, exercise, and practice good nutrition.  SEEK MEDICAL CARE IF:   You are getting worse rather than better.   Your symptoms are not controlled by medicine.   You have chills.  You have worsening shortness of breath.  You have brown or red mucus.  You have yellow or brown nasal  discharge.  You have pain in your face, especially when you bend forward.  You have a fever.  You have swollen neck glands.  You have pain while swallowing.  You have white areas in the back of your throat. SEEK IMMEDIATE MEDICAL CARE IF:   You have severe or persistent:  Headache.  Ear pain.  Sinus pain.  Chest pain.  You have chronic lung disease and any of the following:  Wheezing.  Prolonged cough.  Coughing up blood.  A change in your usual mucus.  You have a stiff neck.  You have changes in your:  Vision.  Hearing.  Thinking.  Mood. MAKE SURE YOU:   Understand these instructions.  Will watch your condition.  Will get help right away if you are not doing well or get worse.   This information is not intended to replace advice given to you by your health care provider. Make sure you discuss any questions you have with your health care provider.   Document Released: 06/06/2001 Document Revised: 04/27/2015 Document Reviewed: 03/18/2014 Elsevier Interactive Patient Education 2016 Elsevier Inc.  

## 2015-12-21 NOTE — Progress Notes (Signed)
Subjective:     Patient ID: Clifford Rowe, male   DOB: 08-05-1986, 29 y.o.   MRN: [id number]  HPI Pt with several day hx of S/T and bilat ear pain No OTC meds for sx  Review of Systems  Constitutional: Negative for fever, chills, activity change, appetite change and fatigue.  HENT: Positive for congestion, ear pain, postnasal drip, sinus pressure, sore throat and voice change. Negative for facial swelling, hearing loss, rhinorrhea, sneezing and trouble swallowing.   Respiratory: Negative.   Cardiovascular: Negative.        Objective:   Physical Exam  Constitutional: He appears well-developed and well-nourished.  HENT:  Right Ear: External ear normal.  Left Ear: External ear normal.  Mouth/Throat: No oropharyngeal exudate.  TM's with fluid but nl landmarks bilat  Neck: Normal range of motion. Neck supple.  Cardiovascular: Normal rate, regular rhythm and normal heart sounds.   No murmur heard. Pulmonary/Chest: Effort normal and breath sounds normal.  Lymphadenopathy:    He has no cervical adenopathy.  Nursing note and vitals reviewed.      Assessment:     URI- viral    Plan:     Fluids Rest OTC Claritin D F/U prn

## 2015-12-24 ENCOUNTER — Telehealth: Payer: Self-pay | Admitting: Family

## 2015-12-24 MED ORDER — AZITHROMYCIN 250 MG PO TABS: ORAL_TABLET | ORAL | Status: DC

## 2015-12-24 NOTE — Telephone Encounter (Signed)
[facility] Pak Prescription sent to pharmacy

## 2015-12-24 NOTE — Telephone Encounter (Signed)
Left message rx sent to pharmacy

## 2015-12-24 NOTE — Telephone Encounter (Signed)
Please advise 

## 2016-03-27 ENCOUNTER — Ambulatory Visit (INDEPENDENT_AMBULATORY_CARE_PROVIDER_SITE_OTHER): Payer: Medicare Other

## 2016-03-27 ENCOUNTER — Encounter: Payer: Self-pay | Admitting: Family Medicine

## 2016-03-27 ENCOUNTER — Ambulatory Visit (INDEPENDENT_AMBULATORY_CARE_PROVIDER_SITE_OTHER): Payer: Medicare Other | Admitting: Family Medicine

## 2016-03-27 VITALS — BP 117/76 | HR 69 | Temp 97.6°F | Ht 74.0 in | Wt 167.4 lb

## 2016-03-27 DIAGNOSIS — S39012A Strain of muscle, fascia and tendon of lower back, initial encounter: Secondary | ICD-10-CM

## 2016-03-27 MED ORDER — MELOXICAM 15 MG PO TABS: 15.0000 mg | ORAL_TABLET | Freq: Every day | ORAL | Status: DC

## 2016-03-27 MED ORDER — CYCLOBENZAPRINE HCL 10 MG PO TABS: 10.0000 mg | ORAL_TABLET | Freq: Three times a day (TID) | ORAL | Status: DC | PRN

## 2016-03-27 NOTE — Progress Notes (Signed)
BP 117/76 mmHg  Pulse 69  Temp(Src) 97.6 F (36.4 C) (Oral)  Ht 6\' 2"  (1.88 m)  Wt 167 lb 6.4 oz (75.932 kg)  BMI 21.48 kg/m2   Subjective:    Patient ID: Clifford Rowe, male    DOB: 11-05-1986, 30 y.o.   MRN: [id number]  HPI: Clifford Rowe is a 30 y.o. male presenting on 03/27/2016 for Back Pain   HPI Low back pain Patient presents today because he has been having 3 or 4 days of bilateral and midline low back pain. He has had this pain previously but never usually lasts as long as it does currently. He has been taking or 100 mg of ibuprofen daily try and combat it and it helped some but not completely. Does not usually resolves the pain that he gets. He denies any fevers or chills or overlying skin changes. He denies any numbness or weakness or pain shooting down either leg. The pain is worse on the right side than the left. He is also been taking Tylenol as well. 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  Relevant past medical, surgical, family and social history reviewed and updated as indicated. Interim medical history since our last visit reviewed. Allergies and medications reviewed and updated.  Review of Systems  Constitutional: Negative for fever and chills.  HENT: Negative for ear discharge and ear pain.   Eyes: Negative for discharge and visual disturbance.  Respiratory: Negative for shortness of breath and wheezing.   Cardiovascular: Negative for chest pain and leg swelling.  Gastrointestinal: Negative for abdominal pain, diarrhea and constipation.  Genitourinary: Negative for difficulty urinating.  Musculoskeletal: Positive for myalgias and back pain. Negative for joint swelling and gait problem.  Skin: Negative for color change and rash.  Neurological: Negative for syncope, light-headedness and headaches.  All other systems reviewed and are negative.   Per HPI unless specifically indicated above     Medication List       This list is accurate as of: 03/27/16 10:20 AM.  Always use your most recent med list.               cyclobenzaprine 10 MG tablet  Commonly known as:  FLEXERIL  Take 1 tablet (10 mg total) by mouth 3 (three) times daily as needed for muscle spasms.     meloxicam 15 MG tablet  Commonly known as:  MOBIC  Take 1 tablet (15 mg total) by mouth daily.     pantoprazole 40 MG tablet  Commonly known as:  PROTONIX  TAKE ONE TABLET BY MOUTH TWICE DAILY           Objective:    BP 117/76 mmHg  Pulse 69  Temp(Src) 97.6 F (36.4 C) (Oral)  Ht 6\' 2"  (1.88 m)  Wt 167 lb 6.4 oz (75.932 kg)  BMI 21.48 kg/m2  Wt Readings from Last 3 Encounters:  03/27/16 167 lb 6.4 oz (75.932 kg)  12/21/15 169 lb 9.6 oz (76.93 kg)  11/29/15 169 lb 9.6 oz (76.93 kg)    Physical Exam  Constitutional: He is oriented to person, place, and time. He appears well-developed and well-nourished. No distress.  Eyes: Conjunctivae and EOM are normal. Pupils are equal, round, and reactive to light. Right eye exhibits no discharge. No scleral icterus.  Cardiovascular: Normal rate, regular rhythm, normal heart sounds and intact distal pulses.   No murmur heard. Pulmonary/Chest: Effort  normal and breath sounds normal. No respiratory distress. He has no wheezes.  Abdominal: He exhibits no distension. There is no tenderness. There is no rebound.  Musculoskeletal: Normal range of motion. He exhibits no edema.       Lumbar back: He exhibits tenderness and bony tenderness (Midline and bilateral L3-L4 lumbar pain. Negative straight leg raise bilaterally.). He exhibits normal range of motion, no swelling, no edema, no deformity and no laceration.  Neurological: He is alert and oriented to person, place, and time. Coordination normal.  Skin: Skin is warm and dry. No rash noted. He is not diaphoretic.  Psychiatric: He has a normal mood and affect. His behavior is normal.  Vitals reviewed.       Assessment & Plan:   Problem List Items Addressed This Visit    None    Visit Diagnoses    Lumbar strain, initial encounter    -  Primary    Williams Mobic and Flexeril, patient does have some midline pain so we'll do x-ray. If not improved in a week and call us back.    Relevant Medications    meloxicam (MOBIC) 15 MG tablet    cyclobenzaprine (FLEXERIL) 10 MG tablet    Other Relevant Orders    DG Lumbar Spine 2-3 Views       Follow up plan: Return in about 4 weeks (around 04/24/2016), or if symptoms worsen or fail to improve, for Routine physical and labs, fasting.  Counseling provided for all of the vaccine components Orders Placed This Encounter  Procedures  . DG Lumbar Spine 2-3 [place], MD [facility] 03/27/2016, 10:20 AM

## 2016-04-18 ENCOUNTER — Other Ambulatory Visit: Payer: Self-pay | Admitting: *Deleted

## 2016-04-18 MED ORDER — PANTOPRAZOLE SODIUM 40 MG PO TBEC: 40.0000 mg | DELAYED_RELEASE_TABLET | Freq: Two times a day (BID) | ORAL | Status: DC

## 2016-04-18 NOTE — Addendum Note (Signed)
Addended by: Thana Ates on: 04/18/2016 10:22 AM   Modules accepted: Orders

## 2016-06-28 ENCOUNTER — Other Ambulatory Visit: Payer: Self-pay | Admitting: Family Medicine

## 2016-07-27 DIAGNOSIS — I861 Scrotal varices: Secondary | ICD-10-CM | POA: Diagnosis not present

## 2016-08-11 ENCOUNTER — Encounter: Payer: Self-pay | Admitting: Family Medicine

## 2016-08-11 ENCOUNTER — Ambulatory Visit (INDEPENDENT_AMBULATORY_CARE_PROVIDER_SITE_OTHER): Payer: Medicare Other | Admitting: Family Medicine

## 2016-08-11 VITALS — BP 121/79 | HR 67 | Temp 98.6°F | Ht 74.0 in | Wt 164.8 lb

## 2016-08-11 DIAGNOSIS — S80922A Unspecified superficial injury of left lower leg, initial encounter: Secondary | ICD-10-CM

## 2016-08-11 DIAGNOSIS — W57XXXA Bitten or stung by nonvenomous insect and other nonvenomous arthropods, initial encounter: Secondary | ICD-10-CM | POA: Diagnosis not present

## 2016-08-11 DIAGNOSIS — S80921A Unspecified superficial injury of right lower leg, initial encounter: Secondary | ICD-10-CM | POA: Diagnosis not present

## 2016-08-11 MED ORDER — DOXYCYCLINE MONOHYDRATE 100 MG PO TABS: 100.0000 mg | ORAL_TABLET | Freq: Two times a day (BID) | ORAL | 0 refills | Status: DC

## 2016-08-11 NOTE — Progress Notes (Signed)
BP 121/79 (BP Location: Left Arm, Patient Position: Sitting, Cuff Size: Large)   Pulse 67   Temp 98.6 F (37 C) (Oral)   Ht 6\' 2"  (1.88 m)   Wt 164 lb 12.8 oz (74.8 kg)   BMI 21.16 kg/m    Subjective:    Patient ID: Clifford Rowe, male    DOB: August 16, 1986, 30 y.o.   MRN: [id number]  HPI: Clifford Rowe is a 30 y.o. male presenting on 08/11/2016 for Insect Bite (bites all over legs, pulled what may have been small ticks off of his legs 3-4 days ago)   HPI Tick bites Patient has pulled 3 or 4 takes all of his right inner ankle that were very tiny over the past 3 days. He has not developed any rashes or fevers or aches or chills. He's never had any reaction such as Lyme disease in [place] spotted fever to ticks previously. The sites have developed small pink bumps but had no signs of surrounding erythema or drainage.  Relevant past medical, surgical, family and social history reviewed and updated as indicated. Interim medical history since our last visit reviewed. Allergies and medications reviewed and updated.  Review of Systems  Constitutional: Negative for fever.  HENT: Negative for ear discharge and ear pain.   Eyes: Negative for discharge and visual disturbance.  Respiratory: Negative for shortness of breath and wheezing.   Cardiovascular: Negative for chest pain and leg swelling.  Gastrointestinal: Negative for abdominal pain, constipation and diarrhea.  Genitourinary: Negative for difficulty urinating.  Musculoskeletal: Negative for back pain and gait problem.  Skin: Positive for rash.  Neurological: Negative for syncope, light-headedness and headaches.  All other systems reviewed and are negative.   Per HPI unless specifically indicated above     Medication List       Accurate as of 08/11/16  8:38 AM. Always use your most recent med list.          doxycycline 100 MG tablet Commonly known as:  ADOXA Take 1 tablet (100 mg total) by mouth 2 (two) times daily.     pantoprazole 40 MG tablet Commonly known as:  PROTONIX TAKE 1 TABLET (40 MG TOTAL) BY MOUTH 2 (TWO) TIMES DAILY.          Objective:    BP 121/79 (BP Location: Left Arm, Patient Position: Sitting, Cuff Size: Large)   Pulse 67   Temp 98.6 F (37 C) (Oral)   Ht 6\' 2"  (1.88 m)   Wt 164 lb 12.8 oz (74.8 kg)   BMI 21.16 kg/m   Wt Readings from Last 3 Encounters:  08/11/16 164 lb 12.8 oz (74.8 kg)  03/27/16 167 lb 6.4 oz (75.9 kg)  12/21/15 169 lb 9.6 oz (76.9 kg)    Physical Exam  Constitutional: He is oriented to person, place, and time. He appears well-developed and well-nourished. No distress.  Eyes: Conjunctivae and EOM are normal. Pupils are equal, round, and reactive to light. Right eye exhibits no discharge. No scleral icterus.  Cardiovascular: Normal rate, regular rhythm, normal heart sounds and intact distal pulses.   No murmur heard. Pulmonary/Chest: Effort normal and breath sounds normal. No respiratory distress. He has no wheezes.  Musculoskeletal: Normal range of motion. He exhibits no edema.  Neurological: He is alert and oriented to person, place, and time. Coordination normal.  Skin: Skin is warm and dry. Rash (3 small pink papules on right inner ankle. No signs of surrounding erythema or purulence.) noted. He is not  diaphoretic.  Psychiatric: He has a normal mood and affect. His behavior is normal.  Nursing note and vitals reviewed.     Assessment & Plan:   Problem List Items Addressed This Visit    None    Visit Diagnoses    Tick bite    -  Primary   Patient had a few tick bites on his right inner ankle. We'll give doxycycline   Relevant Medications   doxycycline (ADOXA) 100 MG tablet       Follow up plan: Return if symptoms worsen or fail to improve.  Counseling provided for all of the vaccine components No orders of the defined types were placed in this encounter.   Caryl Pina, MD [facility] 08/11/2016, 8:38  AM

## 2016-08-25 ENCOUNTER — Other Ambulatory Visit: Payer: Self-pay | Admitting: Family Medicine

## 2016-10-12 ENCOUNTER — Ambulatory Visit (INDEPENDENT_AMBULATORY_CARE_PROVIDER_SITE_OTHER): Payer: Medicare Other | Admitting: Family Medicine

## 2016-10-12 ENCOUNTER — Encounter: Payer: Self-pay | Admitting: Family Medicine

## 2016-10-12 ENCOUNTER — Ambulatory Visit (INDEPENDENT_AMBULATORY_CARE_PROVIDER_SITE_OTHER): Payer: Medicare Other

## 2016-10-12 VITALS — BP 136/84 | HR 94 | Temp 97.6°F | Ht 74.0 in | Wt 171.4 lb

## 2016-10-12 DIAGNOSIS — M5441 Lumbago with sciatica, right side: Secondary | ICD-10-CM

## 2016-10-12 MED ORDER — CYCLOBENZAPRINE HCL 10 MG PO TABS: 10.0000 mg | ORAL_TABLET | Freq: Three times a day (TID) | ORAL | 1 refills | Status: DC | PRN

## 2016-10-12 MED ORDER — PREDNISONE 20 MG PO TABS: 20.0000 mg | ORAL_TABLET | Freq: Every day | ORAL | 0 refills | Status: DC

## 2016-10-12 NOTE — Patient Instructions (Signed)
Great to meet you!  Come back if you are not getting better or are getting worse.   Signs of back pain emergencies are having trouble controlling your bowels or bladder, numbness or tingling in the underwear area, leg weakness, or excruciating pain.

## 2016-10-12 NOTE — Progress Notes (Signed)
   HPI  Patient presents today here with right-sided low back pain  Patient explains that 6-7days ago he was unloading a large load of sand a few days  The back pain started 4-5 days ago  It's described as midline low back pain with right-sided numbness and tingling down the back of his leg it's persistent and achy in nature. He had intermittent ith his back.  He denies bowel or bladder dysfunction, saddle anesthesia, or leg weakness.   NSAIDs have not helped, Flexeril helped some.   PMH: Smoking status noted ROS: Per HPI  Objective: BP 136/84   Pulse 94   Temp 97.6 F (36.4 C) (Oral)   Ht 6\' 2"  (1.88 m)   Wt 171 lb 6.4 oz (77.7 kg)   BMI 22.01 kg/m  Gen: NAD, alert, cooperative with exam HEENT: NCAT Ext: No edema, warm Neuro: Alert and oriented, No gross deficits  Assessment and plan:  # Low back pain with sciatica Acute onset with recurrence Prednisone considering radiclopathy Flexeril Low threshold for return PT ordered for long term improvement  Red flags reviewed   Orders Placed This Encounter  Procedures  . DG Lumbar Spine 2-3 Views    Standing Status:   Future    Standing Expiration Date:   12/12/2017    Order Specific Question:   Reason for Exam (SYMPTOM  OR DIAGNOSIS REQUIRED)    Answer:   midline lumbar pain with scaitica R sided    Order Specific Question:   Preferred imaging location?    Answer:   Internal  . Ambulatory referral to Physical Therapy    Referral Priority:   Routine    Referral Type:   Physical Medicine    Referral Reason:   Specialty Services Required    Requested Specialty:   Physical Therapy    Number of Visits Requested:   1    Meds ordered this encounter  Medications  . cyclobenzaprine (FLEXERIL) 10 MG tablet    Sig: Take 1 tablet (10 mg total) by mouth 3 (three) times daily as needed for muscle spasms.    Dispense:  30 tablet    Refill:  1  . predniSONE (DELTASONE) 20 MG tablet    Sig: Take 1 tablet (20 mg total) by  mouth daily with breakfast.    Dispense:  10 tablet    Refill:  0    Laroy Apple, MD [facility] 10/12/2016, 10:33 AM

## 2016-10-16 ENCOUNTER — Other Ambulatory Visit: Payer: Self-pay | Admitting: Family Medicine

## 2016-10-16 NOTE — Telephone Encounter (Signed)
Has to be seen to receive stronger meds and to document need of MRI.   Laroy Apple, MD [facility] 10/16/2016, 12:02 PM

## 2016-10-16 NOTE — Telephone Encounter (Signed)
Pt aware & appt set for tomorrow

## 2016-10-17 ENCOUNTER — Ambulatory Visit (INDEPENDENT_AMBULATORY_CARE_PROVIDER_SITE_OTHER): Payer: Medicare Other | Admitting: Family Medicine

## 2016-10-17 ENCOUNTER — Encounter: Payer: Self-pay | Admitting: Family Medicine

## 2016-10-17 VITALS — BP 128/82 | HR 84 | Temp 97.4°F | Ht 74.0 in | Wt 167.2 lb

## 2016-10-17 DIAGNOSIS — R0982 Postnasal drip: Secondary | ICD-10-CM | POA: Diagnosis not present

## 2016-10-17 DIAGNOSIS — M5441 Lumbago with sciatica, right side: Secondary | ICD-10-CM

## 2016-10-17 MED ORDER — METHOCARBAMOL 500 MG PO TABS: 500.0000 mg | ORAL_TABLET | Freq: Four times a day (QID) | ORAL | 0 refills | Status: DC | PRN

## 2016-10-17 MED ORDER — DICLOFENAC SODIUM 75 MG PO TBEC: 75.0000 mg | DELAYED_RELEASE_TABLET | Freq: Two times a day (BID) | ORAL | 0 refills | Status: DC

## 2016-10-17 NOTE — Progress Notes (Signed)
   HPI  Patient presents today here with continued back pain.  Patient came in 5 days ago with acute right-sided low back pain with sciatica, he was started on prednisone and Flexeril with no improvement.  Patient has been taking tonics twice daily for GERD symptoms for some time, he denies any history of PUD.  He describes midline tightness type pain in the lumbar area, last week before presentation he had right-sided posterior leg numbness which improved with prednisone.  He denies saddle anesthesia, bowel or bladder dysfunction, or leg weakness.  He requests MRI and pain medications. Tramadol has not helped him in the past  By the way: Complains of sore throat for 1 day with frequent throat clearing and nasal congestion. Does not take any medications for this.  PMH: Smoking status noted ROS: Per HPI  Objective: BP 128/82   Pulse 84   Temp 97.4 F (36.3 C) (Oral)   Ht 6\' 2"  (1.88 m)   Wt 167 lb 3.2 oz (75.8 kg)   BMI 21.47 kg/m  Gen: NAD, alert, cooperative with exam HEENT: NCAT, oropharynx erythematous, no exudates on tonsils, tonsils mildly enlarged, turbinates swollen bilaterally CV: RRR, good S1/S2, no murmur Resp: CTABL, no wheezes, non-labored Ext: No edema, warm Neuro: Alert and oriented, strength 5/5 and sensation intact in bilateral lower extremities, 2+ symmetric patellar tendon reflexes, sensation intact bilateral lower extremities  Msk:  Mild tenderness to palpation in midline sacral/lower lumbar area, no tenderness to palpation of paraspinal muscles bilaterally Negative straight leg raise   Assessment and plan:  # Midline low back pain with sciatica Sciatica improving after prednisone However patient continues have persistent pain He requests MRI, however I do not see evidence on his exam of necessity for MRI. Will try Voltaren for pain relief, change Flexeril to methocarbamol. Refer to orthopedic surgery for their opinion. Return to clinic with any  concerns, specifically discussed bowel or bladder dysfunction, saddle anesthesia, or any leg weakness.   # Postnasal drip Recommended daily Zyrtec, Flonase if that does not improve his symptoms One-day symptom of sore throat with frequent throat clearing.      Orders Placed This Encounter  Procedures  . Ambulatory referral to Orthopedic Surgery    Referral Priority:   Routine    Referral Type:   Surgical    Referral Reason:   Specialty Services Required    Requested Specialty:   Orthopedic Surgery    Number of Visits Requested:   1    Meds ordered this encounter  Medications  . diclofenac (VOLTAREN) 75 MG EC tablet    Sig: Take 1 tablet (75 mg total) by mouth 2 (two) times daily.    Dispense:  30 tablet    Refill:  0  . methocarbamol (ROBAXIN) 500 MG tablet    Sig: Take 1 tablet (500 mg total) by mouth every 6 (six) hours as needed for muscle spasms.    Dispense:  40 tablet    Refill:  0    Laroy Apple, MD [facility] 10/17/2016, 8:18 AM

## 2016-10-17 NOTE — Patient Instructions (Signed)
Great to see you!  Try methocarbamol as an alternative muscle relaxer, do not combine it with flexeril.   Try diclofenac as an alternative pain medicine. IF it makes your stomach upset, take with food.   You will be called for an orthopedic surgery appointment

## 2016-10-20 ENCOUNTER — Telehealth: Payer: Self-pay | Admitting: Family Medicine

## 2016-10-20 NOTE — Telephone Encounter (Signed)
Patient taking robaxin and methocarbamol.   Was seen for low back pain.  He wants something that will help with pain better.   Please advise.

## 2016-10-20 NOTE — Telephone Encounter (Signed)
Discussed with patient. He will try the Tylenol with Voltaren. He will let us know if this doesn't help

## 2016-10-20 NOTE — Telephone Encounter (Signed)
Would recommend taking voltaren and 2 tylenol together. If he needs strong meds he will have to be seen again.   Laroy Apple, MD [facility] 10/20/2016, 5:57 PM

## 2016-10-27 DIAGNOSIS — M5441 Lumbago with sciatica, right side: Secondary | ICD-10-CM | POA: Diagnosis not present

## 2016-11-05 ENCOUNTER — Other Ambulatory Visit: Payer: Self-pay | Admitting: Family Medicine

## 2016-12-22 ENCOUNTER — Encounter (INDEPENDENT_AMBULATORY_CARE_PROVIDER_SITE_OTHER): Payer: Self-pay

## 2016-12-22 ENCOUNTER — Ambulatory Visit (INDEPENDENT_AMBULATORY_CARE_PROVIDER_SITE_OTHER): Payer: Medicare Other | Admitting: Family

## 2016-12-22 ENCOUNTER — Encounter: Payer: Self-pay | Admitting: Family

## 2016-12-22 VITALS — BP 133/89 | HR 71 | Temp 98.6°F | Ht 74.0 in | Wt 166.2 lb

## 2016-12-22 DIAGNOSIS — R110 Nausea: Secondary | ICD-10-CM | POA: Diagnosis not present

## 2016-12-22 MED ORDER — ONDANSETRON HCL 4 MG PO TABS: 4.0000 mg | ORAL_TABLET | Freq: Three times a day (TID) | ORAL | 2 refills | Status: DC | PRN

## 2016-12-22 NOTE — Patient Instructions (Signed)
Nausea, Adult  Nausea is the feeling of an upset stomach or having to vomit. Nausea on its own is not usually a serious concern, but it may be an early sign of a more serious medical problem. As nausea gets worse, it can lead to vomiting. If vomiting develops, or if you are not able to drink enough fluids, you are at risk of becoming dehydrated. Dehydration can make you tired and thirsty, cause you to have a dry mouth, and decrease how often you urinate. Older adults and people with other diseases or a weak immune system are at higher risk for dehydration. The main goals of treating your nausea are:  · To limit repeated nausea episodes.  · To prevent vomiting and dehydration.    Follow these instructions at home:  Follow instructions from your health care provider about how to care for yourself at home.  Eating and drinking   Follow these recommendations as told by your health care provider:  · Take an oral rehydration solution (ORS). This is a drink that is sold at pharmacies and retail stores.  · Drink clear fluids in small amounts as you are able. Clear fluids include water, ice chips, diluted fruit juice, and low-calorie sports drinks.  · Eat bland, easy-to-digest foods in small amounts as you are able. These foods include bananas, applesauce, rice, lean meats, toast, and crackers.  · Avoid drinking fluids that contain a lot of sugar or caffeine, such as energy drinks, sports drinks, and soda.  · Avoid alcohol.  · Avoid spicy or fatty foods.    General instructions   · Drink enough fluid to keep your urine clear or pale yellow.  · Wash your hands often. If soap and water are not available, use hand sanitizer.  · Make sure that all people in your household wash their hands well and often.  · Rest at home while you recover.  · Take over-the-counter and prescription medicines only as told by your health care provider.  · Breathe slowly and deeply when you feel nauseous.  · Watch your condition for any  changes.  · Keep all follow-up visits as told by your health care provider. This is important.  Contact a health care provider if:  · You have a headache.  · You have new symptoms.  · Your nausea gets worse.  · You have a fever.  · You feel light-headed or dizzy.  · You vomit.  · You cannot keep fluids down.  Get help right away if:  · You have pain in your chest, neck, arm, or jaw.  · You feel extremely weak or you faint.  · You have vomit that is bright red or looks like coffee grounds.  · You have bloody or black stools or stools that look like tar.  · You have a severe headache, a stiff neck, or both.  · You have severe pain, cramping, or bloating in your abdomen.  · You have a rash.  · You have difficulty breathing or are breathing very quickly.  · Your heart is beating very quickly.  · Your skin feels cold and clammy.  · You feel confused.  · You have pain when you urinate.  · You have signs of dehydration, such as:  ? Dark urine, very little, or no urine.  ? Cracked lips.  ? Dry mouth.  ? Sunken eyes.  ? Sleepiness.  ? Weakness.  These symptoms may represent a serious problem that is an emergency. Do   not wait to see if the symptoms will go away. Get medical help right away. Call your local emergency services (911 in the [place] Do not drive yourself to the hospital.  This information is not intended to replace advice given to you by your health care provider. Make sure you discuss any questions you have with your health care provider.  Document Released: 01/18/2005 Document Revised: 05/15/2016 Document Reviewed: 08/17/2015  Elsevier Interactive Patient Education © 2017 [employer]

## 2016-12-22 NOTE — Progress Notes (Signed)
   Subjective:    Patient ID: Clifford Rowe, male    DOB: 08/17/1986, 30 y.o.   MRN: [id number]  HPI Pt presents to the office today with complaints of nausea and decreased appetite that started two weeks ago. Pt states this is unchanged and denies any abdominal pain, vomiting, fevers, constipation, or diarrhea.  PT denies any use of NSAID's, dysuria, anxiety, weakness, or sick contacts.    Review of Systems  All other systems reviewed and are negative.      Objective:   Physical Exam  Constitutional: He is oriented to person, place, and time. He appears well-developed and well-nourished. No distress.  HENT:  Head: Normocephalic.  Eyes: Pupils are equal, round, and reactive to light. Right eye exhibits no discharge. Left eye exhibits no discharge.  Neck: Normal range of motion. Neck supple. No thyromegaly present.  Cardiovascular: Normal rate, regular rhythm, normal heart sounds and intact distal pulses.   No murmur heard. Pulmonary/Chest: Effort normal and breath sounds normal. No respiratory distress. He has no wheezes.  Abdominal: Soft. Bowel sounds are normal. He exhibits no distension. There is no tenderness.  Musculoskeletal: Normal range of motion. He exhibits no edema or tenderness.  Neurological: He is alert and oriented to person, place, and time.  Skin: Skin is warm and dry. No rash noted. No erythema.  Psychiatric: He has a normal mood and affect. His behavior is normal. Judgment and thought content normal.  Vitals reviewed.     BP 133/89   Pulse 71   Temp 98.6 F (37 C) (Oral)   Ht 6' 2" (1.88 m)   Wt 166 lb 3.2 oz (75.4 kg)   BMI 21.34 kg/m      Assessment & Plan:  1. Nausea without vomiting Force fluids Bland diet, avoid fatty and fried foods Labs pending Zofran Prescription sent to pharmacy RTO prn or if symptoms worsen - CMP14+EGFR - CBC with Differential/Platelet - H Pylori, IGM, IGG, IGA AB - ondansetron (ZOFRAN) 4 MG tablet; Take 1 tablet (4 mg  total) by mouth every 8 (eight) hours as needed for nausea or vomiting.  Dispense: 20 tablet; Refill: 2  Christy Hawks, FNP  

## 2016-12-26 LAB — CBC WITH DIFFERENTIAL/PLATELET
BASOS: 0 %
Basophils Absolute: 0 10*3/uL (ref 0.0–0.2)
EOS (ABSOLUTE): 0.2 10*3/uL (ref 0.0–0.4)
EOS: 3 %
HEMATOCRIT: 49.7 % (ref 37.5–51.0)
HEMOGLOBIN: 16.6 g/dL (ref 13.0–17.7)
Immature Grans (Abs): 0 10*3/uL (ref 0.0–0.1)
Immature Granulocytes: 0 %
LYMPHS ABS: 1.3 10*3/uL (ref 0.7–3.1)
Lymphs: 19 %
MCH: 29.5 pg (ref 26.6–33.0)
MCHC: 33.4 g/dL (ref 31.5–35.7)
MCV: 88 fL (ref 79–97)
MONOCYTES: 9 %
Monocytes Absolute: 0.6 10*3/uL (ref 0.1–0.9)
Neutrophils Absolute: 4.7 10*3/uL (ref 1.4–7.0)
Neutrophils: 69 %
Platelets: 217 10*3/uL (ref 150–379)
RBC: 5.63 x10E6/uL (ref 4.14–5.80)
RDW: 14.4 % (ref 12.3–15.4)
WBC: 6.8 10*3/uL (ref 3.4–10.8)

## 2016-12-26 LAB — CMP14+EGFR
A/G RATIO: 1.8 (ref 1.2–2.2)
ALT: 8 IU/L (ref 0–44)
AST: 8 IU/L (ref 0–40)
Albumin: 4.6 g/dL (ref 3.5–5.5)
Alkaline Phosphatase: 80 IU/L (ref 39–117)
BILIRUBIN TOTAL: 1.4 mg/dL — AB (ref 0.0–1.2)
BUN/Creatinine Ratio: 7 — ABNORMAL LOW (ref 9–20)
BUN: 7 mg/dL (ref 6–20)
CO2: 27 mmol/L (ref 18–29)
Calcium: 9.7 mg/dL (ref 8.7–10.2)
Chloride: 102 mmol/L (ref 96–106)
Creatinine, Ser: 1.05 mg/dL (ref 0.76–1.27)
GFR calc Af Amer: 108 mL/min/{1.73_m2} (ref >59)
GFR calc non Af Amer: 93 mL/min/{1.73_m2} (ref >59)
GLOBULIN, TOTAL: 2.6 g/dL (ref 1.5–4.5)
Glucose: 93 mg/dL (ref 65–99)
Potassium: 4.4 mmol/L (ref 3.5–5.2)
SODIUM: 146 mmol/L — AB (ref 134–144)
Total Protein: 7.2 g/dL (ref 6.0–8.5)

## 2016-12-26 LAB — H PYLORI, IGM, IGG, IGA AB: H Pylori IgG: 1.1 U/mL — ABNORMAL HIGH (ref 0.0–0.8)

## 2017-02-20 DIAGNOSIS — Z87438 Personal history of other diseases of male genital organs: Secondary | ICD-10-CM | POA: Diagnosis not present

## 2017-02-20 DIAGNOSIS — I861 Scrotal varices: Secondary | ICD-10-CM | POA: Diagnosis not present

## 2017-02-20 DIAGNOSIS — N50819 Testicular pain, unspecified: Secondary | ICD-10-CM | POA: Diagnosis not present

## 2017-03-01 DIAGNOSIS — I861 Scrotal varices: Secondary | ICD-10-CM | POA: Diagnosis not present

## 2017-03-23 ENCOUNTER — Other Ambulatory Visit: Payer: Self-pay | Admitting: Family Medicine

## 2017-05-05 ENCOUNTER — Other Ambulatory Visit: Payer: Self-pay | Admitting: Family Medicine

## 2017-07-06 ENCOUNTER — Encounter: Payer: Self-pay | Admitting: Family Medicine

## 2017-07-06 ENCOUNTER — Ambulatory Visit (INDEPENDENT_AMBULATORY_CARE_PROVIDER_SITE_OTHER): Payer: Medicare Other | Admitting: Family Medicine

## 2017-07-06 VITALS — BP 120/78 | HR 61 | Temp 99.3°F | Ht 74.0 in | Wt 170.0 lb

## 2017-07-06 DIAGNOSIS — M76899 Other specified enthesopathies of unspecified lower limb, excluding foot: Secondary | ICD-10-CM

## 2017-07-06 MED ORDER — NAPROXEN 500 MG PO TABS: 500.0000 mg | ORAL_TABLET | Freq: Two times a day (BID) | ORAL | 0 refills | Status: DC

## 2017-07-06 NOTE — Patient Instructions (Signed)
Great to see you!  Try ice for 15 minutes 4-6 times a day Try to avoid exercises that hurt the area  Take naproxen 1 pill twice daily with food.    Come back or cal if you are not improving as expected.

## 2017-07-06 NOTE — Progress Notes (Signed)
   HPI  Patient presents today here with right leg pain.  Patient explains that he has right leg pain located just above his right knee on the lateral side. He denies any injury. He states that he walks a lot but does not remember a time when it suddenly began. He's had pain slowly worsening there over the last week or so. He tried some muscle relaxers with no improvement. He's able to walk without serious pain.  Pain is described as sharp. Not helped by any medication so far.  PMH: Smoking status noted ROS: Per HPI  Objective: BP 120/78   Pulse 61   Temp 99.3 F (37.4 C) (Oral)   Ht 6\' 2"  (1.88 m)   Wt 170 lb (77.1 kg)   BMI 21.83 kg/m  Gen: NAD, alert, cooperative with exam HEENT: NCAT CV: RRR, good S1/S2, no murmur Resp: CTABL, no wheezes, non-labored Ext: No edema, warm Neuro: Alert and oriented, No gross deficits MSK Tenderness to palpation over the distal end of the biceps femoris tendon. This reproduces the pain. No erythema, gross deformity  Assessment and plan:  # Hamstring tendinitis Schedule NSAIDs 2 weeks Ice Rest, mainly avoid any exercise that may be causing pain.   Meds ordered this encounter  Medications  . naproxen (NAPROSYN) 500 MG tablet    Sig: Take 1 tablet (500 mg total) by mouth 2 (two) times daily with a meal.    Dispense:  30 tablet    Refill:  0    Laroy Apple, MD [facility] 07/06/2017, 6:36 PM

## 2017-08-10 ENCOUNTER — Ambulatory Visit (INDEPENDENT_AMBULATORY_CARE_PROVIDER_SITE_OTHER): Payer: Medicare Other | Admitting: Family Medicine

## 2017-08-10 ENCOUNTER — Encounter: Payer: Self-pay | Admitting: Family Medicine

## 2017-08-10 VITALS — BP 127/81 | HR 68 | Temp 99.0°F | Ht 74.0 in | Wt 165.0 lb

## 2017-08-10 DIAGNOSIS — L237 Allergic contact dermatitis due to plants, except food: Secondary | ICD-10-CM | POA: Diagnosis not present

## 2017-08-10 MED ORDER — PREDNISONE 20 MG PO TABS: ORAL_TABLET | ORAL | 0 refills | Status: DC

## 2017-08-10 NOTE — Progress Notes (Signed)
BP 127/81   Pulse 68   Temp 99 F (37.2 C) (Oral)   Ht 6\' 2"  (1.88 m)   Wt 165 lb (74.8 kg)   BMI 21.18 kg/m    Subjective:    Patient ID: Clifford Rowe, male    DOB: May 25, 1986, 31 y.o.   MRN: [id number]  HPI: Clifford Rowe is a 31 y.o. male presenting on 08/10/2017 for Poison Oak   HPI Rash and pruritus and possible exposure to poison ivy or oak. Patient is coming in today for rash and pruritus and possible exposure to poison ivy or open. Patient says he thinks he was exposed yesterday and the start up with a rash on his leg and then on his left arm as well. Usually he manages on its own with creams but then this time he started feeling his getting around his eye 2. He does not have any rash there yet but he has got a lot of pruritus associated with it. This is why in her common and wants to do some steroids. He says he's had this multiple times in the past but is not as severe as it had been before. He denies any fevers or chills or drainage from any sites. The rash is relatively mild but just very pruritic.  Relevant past medical, surgical, family and social history reviewed and updated as indicated. Interim medical history since our last visit reviewed. Allergies and medications reviewed and updated.  Review of Systems  Constitutional: Negative for chills and fever.  Eyes: Positive for itching.  Respiratory: Negative for shortness of breath and wheezing.   Cardiovascular: Negative for chest pain and leg swelling.  Musculoskeletal: Negative for back pain and gait problem.  Skin: Positive for rash.  All other systems reviewed and are negative.   Per HPI unless specifically indicated above        Objective:    BP 127/81   Pulse 68   Temp 99 F (37.2 C) (Oral)   Ht 6\' 2"  (1.88 m)   Wt 165 lb (74.8 kg)   BMI 21.18 kg/m   Wt Readings from Last 3 Encounters:  08/10/17 165 lb (74.8 kg)  07/06/17 170 lb (77.1 kg)  12/22/16 166 lb 3.2 oz (75.4 kg)    Physical Exam    Constitutional: He is oriented to person, place, and time. He appears well-developed and well-nourished. No distress.  Eyes: Conjunctivae are normal. No scleral icterus.  Cardiovascular: Normal rate, regular rhythm, normal heart sounds and intact distal pulses.   No murmur heard. Pulmonary/Chest: Effort normal and breath sounds normal. No respiratory distress. He has no wheezes.  Musculoskeletal: Normal range of motion. He exhibits no edema.  Neurological: He is alert and oriented to person, place, and time. Coordination normal.  Skin: Skin is warm and dry. Rash noted. Rash is maculopapular (Small areas of maculopapular rash on left inner calf, also on left forearm. Patient says he started to get some pruritus around his right eye but no rashes shown up just yet.). He is not diaphoretic.  Psychiatric: He has a normal mood and affect. His behavior is normal.  Nursing note and vitals reviewed.     Assessment & Plan:   Problem List Items Addressed This Visit    None    Visit Diagnoses    Poison ivy dermatitis    -  Primary   Relevant Medications   predniSONE (DELTASONE) 20 MG tablet       Follow up plan: Return if symptoms  worsen or fail to improve.  Counseling provided for all of the vaccine components No orders of the defined types were placed in this encounter.   Caryl Pina, MD [facility] 08/10/2017, 6:07 PM

## 2017-08-25 ENCOUNTER — Other Ambulatory Visit: Payer: Self-pay | Admitting: Family Medicine

## 2017-09-13 ENCOUNTER — Other Ambulatory Visit: Payer: Self-pay | Admitting: Family Medicine

## 2017-10-03 DIAGNOSIS — R110 Nausea: Secondary | ICD-10-CM | POA: Diagnosis not present

## 2017-10-03 DIAGNOSIS — H8111 Benign paroxysmal vertigo, right ear: Secondary | ICD-10-CM | POA: Diagnosis not present

## 2017-10-31 ENCOUNTER — Ambulatory Visit (INDEPENDENT_AMBULATORY_CARE_PROVIDER_SITE_OTHER): Payer: Medicare Other | Admitting: Family Medicine

## 2017-10-31 ENCOUNTER — Encounter: Payer: Self-pay | Admitting: Family Medicine

## 2017-10-31 ENCOUNTER — Ambulatory Visit (INDEPENDENT_AMBULATORY_CARE_PROVIDER_SITE_OTHER): Payer: Medicare Other

## 2017-10-31 VITALS — BP 118/72 | HR 72 | Temp 98.6°F | Ht 74.0 in | Wt 173.0 lb

## 2017-10-31 DIAGNOSIS — M542 Cervicalgia: Secondary | ICD-10-CM

## 2017-10-31 MED ORDER — PREDNISONE 20 MG PO TABS: ORAL_TABLET | ORAL | 0 refills | Status: DC

## 2017-10-31 NOTE — Progress Notes (Signed)
BP 118/72   Pulse 72   Temp 98.6 F (37 C) (Oral)   Ht 6\' 2"  (1.88 m)   Wt 173 lb (78.5 kg)   BMI 22.21 kg/m    Subjective:    Patient ID: Clifford Rowe, male    DOB: 16-Apr-1986, 31 y.o.   MRN: [id number]  HPI: Clifford Rowe is a 31 y.o. male presenting Rowe 10/31/2017 for Neck pain/cervical pain (x 2 weeks)   HPI Neck pain Complains of neck pain that is in the middle of the lower part of his neck and sometimes goes to the right side of his neck as well.  He says that this is been bothering him over the past 2 weeks.  He cannot recall any specific trauma or incident that brought it upon him.  He does regularly lift things but not any more than he usually does.  He denies any fevers or chills or numbness or weakness going down either arm or his legs.  The pain is worse with flexion and extension but is not bothered by rotation of the head or neck.  Relevant past medical, surgical, family and social history reviewed and updated as indicated. Interim medical history since our last visit reviewed. Allergies and medications reviewed and updated.  Review of Systems  Constitutional: Negative for chills and fever.  HENT: Negative for congestion and sore throat.   Respiratory: Negative for cough, shortness of breath and wheezing.   Cardiovascular: Negative for chest pain and leg swelling.  Musculoskeletal: Positive for neck pain. Negative for back pain, gait problem and neck stiffness.  Skin: Negative for rash.  All other systems reviewed and are negative.   Per HPI unless specifically indicated above     Objective:    BP 118/72   Pulse 72   Temp 98.6 F (37 C) (Oral)   Ht 6\' 2"  (1.88 m)   Wt 173 lb (78.5 kg)   BMI 22.21 kg/m   Wt Readings from Last 3 Encounters:  10/31/17 173 lb (78.5 kg)  08/10/17 165 lb (74.8 kg)  07/06/17 170 lb (77.1 kg)    Physical Exam  Constitutional: He is oriented to person, place, and time. He appears well-developed and well-nourished. No distress.    Eyes: Conjunctivae are normal. No scleral icterus.  Musculoskeletal: Normal range of motion. He exhibits no edema.       Cervical back: He exhibits tenderness and bony tenderness. He exhibits normal range of motion, no swelling, no edema and no deformity.       Back:  Neurological: He is alert and oriented to person, place, and time. Coordination normal.  Skin: Skin is warm and dry. No rash noted. He is not diaphoretic.  Psychiatric: He has a normal mood and affect. His behavior is normal.  Nursing note and vitals reviewed.       Assessment & Plan:   Problem List Items Addressed This Visit    None    Visit Diagnoses    Neck pain, acute    -  Primary   Has midline around C7 tenderness, no radicular symptoms, will do x-ray and prednisone and muscle relaxer   Relevant Medications   predniSONE (DELTASONE) 20 MG tablet   Other Relevant Orders   DG Cervical Spine Complete       Follow up plan: Return if symptoms worsen or fail to improve.  Counseling provided for all of the vaccine components Orders Placed This Encounter  Procedures  . DG Cervical Spine Complete  Caryl Pina, MD [facility] 10/31/2017, 9:30 AM

## 2017-11-23 ENCOUNTER — Other Ambulatory Visit: Payer: Self-pay

## 2017-11-23 MED ORDER — PANTOPRAZOLE SODIUM 40 MG PO TBEC: DELAYED_RELEASE_TABLET | ORAL | 1 refills | Status: DC

## 2017-12-20 ENCOUNTER — Other Ambulatory Visit: Payer: Self-pay | Admitting: Family Medicine

## 2017-12-20 NOTE — Telephone Encounter (Signed)
rx request 

## 2017-12-22 ENCOUNTER — Ambulatory Visit (INDEPENDENT_AMBULATORY_CARE_PROVIDER_SITE_OTHER): Payer: Medicare Other | Admitting: Physician Assistant

## 2017-12-22 ENCOUNTER — Encounter: Payer: Self-pay | Admitting: Physician Assistant

## 2017-12-22 VITALS — BP 135/74 | HR 68 | Temp 98.0°F | Ht 74.0 in | Wt 171.0 lb

## 2017-12-22 DIAGNOSIS — J029 Acute pharyngitis, unspecified: Secondary | ICD-10-CM

## 2017-12-22 MED ORDER — AMOXICILLIN 500 MG PO CAPS: 500.0000 mg | ORAL_CAPSULE | Freq: Three times a day (TID) | ORAL | 0 refills | Status: DC

## 2017-12-22 NOTE — Patient Instructions (Signed)
In a few days you may receive a survey in the mail or online from [employer] regarding your visit with us today. Please take a moment to fill this out. Your feedback is very important to our whole office. It can help us better understand your needs as well as improve your experience and satisfaction. Thank you for taking your time to complete it. We care about you.  Asyah Candler, PA-C  

## 2017-12-24 NOTE — Progress Notes (Signed)
BP 135/74 (BP Location: Left Arm, Patient Position: Sitting, Cuff Size: Normal)   Pulse 68   Temp 98 F (36.7 C) (Oral)   Ht 6\' 2"  (1.88 m)   Wt 171 lb (77.6 kg)   BMI 21.96 kg/m    Subjective:    Patient ID: Clifford Rowe, male    DOB: [birth year], 31 y.o.   MRN: [id number]  HPI: Clifford Rowe is a 31 y.o. male presenting on 12/22/2017 for Sore Throat (x 3 days. No associated symptoms. Hasn't taken anything OTC.)  This patient has had less than 2 days severe fever, chills, myalgias.  Complains of sinus headache and postnasal drainage. There is copious drainage at times. Associated sore throat. Pain with swallowing, decreased appetite and headache.  Exposure to strep.  Relevant past medical, surgical, family and social history reviewed and updated as indicated. Allergies and medications reviewed and updated.  Past Medical History:  Diagnosis Date  . GERD (gastroesophageal reflux disease)     Past Surgical History:  Procedure Laterality Date  . CYSTECTOMY    . SURGERY SCROTAL / TESTICULAR      Review of Systems  Constitutional: Positive for fatigue and fever. Negative for appetite change.  HENT: Positive for sinus pressure and sore throat.   Eyes: Negative.  Negative for pain and visual disturbance.  Respiratory: Negative for cough, chest tightness, shortness of breath and wheezing.   Cardiovascular: Negative.  Negative for chest pain, palpitations and leg swelling.  Gastrointestinal: Negative.  Negative for abdominal pain, diarrhea, nausea and vomiting.  Endocrine: Negative.   Genitourinary: Negative.   Musculoskeletal: Positive for myalgias. Negative for back pain.  Skin: Negative.  Negative for color change and rash.  Neurological: Positive for headaches. Negative for weakness and numbness.  Psychiatric/Behavioral: Negative.     Allergies as of 12/22/2017   No Known Allergies     Medication List        Accurate as of 12/22/17 11:59 PM. Always use your most recent  med list.          amoxicillin 500 MG capsule Commonly known as:  AMOXIL Take 1 capsule (500 mg total) by mouth 3 (three) times daily.   methocarbamol 500 MG tablet Commonly known as:  ROBAXIN TAKE 1 TABLET (500 MG TOTAL) BY MOUTH EVERY 6 (SIX) HOURS AS NEEDED FOR MUSCLE SPASMS.   pantoprazole 40 MG tablet Commonly known as:  PROTONIX TAKE 1 TABLET (40 MG TOTAL) BY MOUTH 2 (TWO) TIMES DAILY.          Objective:    BP 135/74 (BP Location: Left Arm, Patient Position: Sitting, Cuff Size: Normal)   Pulse 68   Temp 98 F (36.7 C) (Oral)   Ht 6\' 2"  (1.88 m)   Wt 171 lb (77.6 kg)   BMI 21.96 kg/m   No Known Allergies  Physical Exam  Constitutional: He is oriented to person, place, and time. He appears well-developed and well-nourished.  HENT:  Head: Normocephalic and atraumatic.  Right Ear: Tympanic membrane and external ear normal. No middle ear effusion.  Left Ear: Tympanic membrane and external ear normal.  No middle ear effusion.  Nose: Rhinorrhea present. No mucosal edema. Right sinus exhibits no maxillary sinus tenderness. Left sinus exhibits no maxillary sinus tenderness.  Mouth/Throat: Uvula is midline. Oropharyngeal exudate and posterior oropharyngeal erythema present.  Eyes: Conjunctivae and EOM are normal. Pupils are equal, round, and reactive to light. Right eye exhibits no discharge. Left eye exhibits no discharge.  Neck: Normal range  of motion.  Cardiovascular: Normal rate, regular rhythm and normal heart sounds.  Pulmonary/Chest: Effort normal and breath sounds normal. No respiratory distress. He has no wheezes.  Abdominal: Soft.  Lymphadenopathy:    He has no cervical adenopathy.  Neurological: He is alert and oriented to person, place, and time.  Skin: Skin is warm and dry.  Psychiatric: He has a normal mood and affect.        Assessment & Plan:   1. Sore throat - Rapid Strep Screen (Not at [facility]) - amoxicillin (AMOXIL) 500 MG capsule; Take 1  capsule (500 mg total) by mouth 3 (three) times daily.  Dispense: 30 capsule; Refill: 0    Current Outpatient Medications:  .  amoxicillin (AMOXIL) 500 MG capsule, Take 1 capsule (500 mg total) by mouth 3 (three) times daily., Disp: 30 capsule, Rfl: 0 .  methocarbamol (ROBAXIN) 500 MG tablet, TAKE 1 TABLET (500 MG TOTAL) BY MOUTH EVERY 6 (SIX) HOURS AS NEEDED FOR MUSCLE SPASMS., Disp: 40 tablet, Rfl: 0 .  pantoprazole (PROTONIX) 40 MG tablet, TAKE 1 TABLET (40 MG TOTAL) BY MOUTH 2 (TWO) TIMES DAILY., Disp: 180 tablet, Rfl: 1 Continue all other maintenance medications as listed above.  Follow up plan: Return if symptoms worsen or fail to improve.  Educational handout given for [place] PA-C [place] [address]  [place], [place] [address] [phone]   12/24/2017, 9:24 AM

## 2017-12-26 LAB — CULTURE, GROUP A STREP

## 2017-12-26 LAB — RAPID STREP SCREEN (MED CTR MEBANE ONLY): Strep Gp A Ag, IA W/Reflex: NEGATIVE

## 2018-01-14 ENCOUNTER — Ambulatory Visit: Payer: Medicare HMO | Admitting: Nurse Practitioner

## 2018-01-14 ENCOUNTER — Encounter: Payer: Self-pay | Admitting: Nurse Practitioner

## 2018-01-14 DIAGNOSIS — R519 Headache, unspecified: Secondary | ICD-10-CM

## 2018-01-14 DIAGNOSIS — R51 Headache: Secondary | ICD-10-CM

## 2018-01-14 MED ORDER — KETOROLAC TROMETHAMINE 60 MG/2ML IM SOLN
60.0000 mg | Freq: Once | INTRAMUSCULAR | Status: AC
  Administered 2018-01-14: 60 mg via INTRAMUSCULAR

## 2018-01-14 NOTE — Progress Notes (Signed)
   Subjective:    Patient ID: Clifford Rowe, male    DOB: 1986-11-12, 32 y.o.   MRN: [id number]  HPI Patient comes in today c/o headaches. Having at least couple of times a week. He is taking advil or aleve which usually helps but takes several hours. Usually around right eye. He has 2 cysts in his right nasal passage but has not had checked in several years. Rates pain 7-10/10. Can last several hours. Associated symptoms include: nausea and  light problems. Denies blurred vison. Rates current headache 4/10.    Review of Systems  Constitutional: Negative for appetite change, chills and fever.  HENT: Positive for congestion and sinus pressure. Negative for sore throat, trouble swallowing and voice change.   Respiratory: Negative for cough and shortness of breath.   Genitourinary: Negative.   Neurological: Negative.   Psychiatric/Behavioral: Negative.   All other systems reviewed and are negative.      Objective:   Physical Exam  Constitutional: He is oriented to person, place, and time. He appears well-developed and well-nourished. No distress.  HENT:  Right Ear: Hearing, tympanic membrane, external ear and ear canal normal.  Left Ear: Hearing, tympanic membrane, external ear and ear canal normal.  Nose: Mucosal edema and rhinorrhea present. Right sinus exhibits no maxillary sinus tenderness and no frontal sinus tenderness. Left sinus exhibits no maxillary sinus tenderness and no frontal sinus tenderness.  Mouth/Throat: Uvula is midline and oropharynx is clear and moist.  Eyes: Conjunctivae and EOM are normal. Pupils are equal, round, and reactive to light.  Neck: Normal range of motion. Neck supple.  Cardiovascular: Normal rate and regular rhythm.  Pulmonary/Chest: Effort normal and breath sounds normal.  Lymphadenopathy:    He has no cervical adenopathy.  Neurological: He is alert and oriented to person, place, and time. He has normal reflexes. No cranial nerve deficit.  Skin: Skin is  warm.  Psychiatric: He has a normal mood and affect. His behavior is normal. Judgment and thought content normal.    BP 125/76   Pulse 74   Temp 97.7 F (36.5 C) (Oral)   Ht 6\' 2"  (1.88 m)   Wt 170 lb (77.1 kg)   BMI 21.83 kg/m       Assessment & Plan:   1. Acute nonintractable headache, unspecified headache type    Meds ordered this encounter  Medications  . ketorolac (TORADOL) injection 60 mg   Orders Placed This Encounter  Procedures  . CT Head Wo Contrast    Standing Status:   Future    Standing Expiration Date:   04/15/2019    Order Specific Question:   Preferred imaging location?    Answer:   [facility]    Order Specific Question:   Radiology Contrast Protocol - do NOT remove file path    Answer:   [email or web address]    Rest Keep diary of episodes Motrin or tylenol OTC if reoccurs Will talk once CT results are back Avoid caffeine  Mary-Margaret Hassell Done, FNP

## 2018-01-14 NOTE — Patient Instructions (Signed)

## 2018-01-25 ENCOUNTER — Ambulatory Visit (HOSPITAL_COMMUNITY)
Admission: RE | Admit: 2018-01-25 | Discharge: 2018-01-25 | Disposition: A | Discharge status: Home / Self Care | Payer: Medicare HMO | Source: Ambulatory Visit | Attending: Nurse Practitioner | Admitting: Nurse Practitioner

## 2018-01-25 DIAGNOSIS — R519 Headache, unspecified: Secondary | ICD-10-CM

## 2018-01-25 DIAGNOSIS — R51 Headache: Secondary | ICD-10-CM | POA: Diagnosis not present

## 2018-01-25 DIAGNOSIS — J341 Cyst and mucocele of nose and nasal sinus: Secondary | ICD-10-CM | POA: Insufficient documentation

## 2018-02-28 ENCOUNTER — Ambulatory Visit (INDEPENDENT_AMBULATORY_CARE_PROVIDER_SITE_OTHER): Payer: Medicare HMO | Admitting: Family Medicine

## 2018-02-28 ENCOUNTER — Encounter: Payer: Self-pay | Admitting: Family Medicine

## 2018-02-28 VITALS — BP 127/81 | HR 101 | Temp 100.4°F | Ht 74.0 in | Wt 166.0 lb

## 2018-02-28 DIAGNOSIS — R6889 Other general symptoms and signs: Secondary | ICD-10-CM | POA: Diagnosis not present

## 2018-02-28 LAB — VERITOR FLU A/B WAIVED
INFLUENZA B: NEGATIVE
Influenza A: NEGATIVE

## 2018-02-28 MED ORDER — OSELTAMIVIR PHOSPHATE 75 MG PO CAPS: 75.0000 mg | ORAL_CAPSULE | Freq: Two times a day (BID) | ORAL | 0 refills | Status: DC

## 2018-02-28 NOTE — Progress Notes (Signed)
BP 127/81   Pulse (!) 101   Temp (!) 100.4 F (38 C) (Oral)   Ht 6\' 2"  (1.88 m)   Wt 166 lb (75.3 kg)   BMI 21.31 kg/m    Subjective:    Patient ID: Clifford Rowe, male    DOB: October 13, 1986, 32 y.o.   MRN: [id number]  HPI: Clifford Rowe is a 32 y.o. male presenting on 02/28/2018 for Headache, cough, chest congestion, fever, chills (girlfriend diagnosed with flu 4 days ago)   HPI  Pt presents with productive cough, congestion, fever, and chills that started about 2 days ago. Also has headaches and chest wall pain from coughing associated with this. Stated his symptoms came on very suddenly and have not improved. He has been running a fever at home up to 100.75F and today has a temp of up to 100.50F. He is worried this is the flu because his girlfriend was diagnosed with the flu yesterday (was seen here in the clinic and given Tamiflu, per pt). He did not receive his flu shot this year. Denies body aches, SOB, wheezing. He has taken Aleve and some OTC cough medicine that has been helping mildly. Will run flu test.   Relevant past medical, surgical, family and social history reviewed and updated as indicated. Interim medical history since our last visit reviewed. Allergies and medications reviewed and updated.  Review of Systems  Constitutional: Positive for activity change, chills, fatigue and fever. Negative for appetite change.  HENT: Positive for congestion and rhinorrhea. Negative for ear discharge, ear pain, sinus pressure, sinus pain and sore throat.   Eyes: Negative for pain and itching.  Respiratory: Positive for cough. Negative for chest tightness, shortness of breath and wheezing.   Cardiovascular: Negative for chest pain.  Gastrointestinal: Negative for constipation, diarrhea, nausea and vomiting.  Musculoskeletal: Negative for myalgias.  Neurological: Positive for headaches.    Per HPI unless specifically indicated above   Allergies as of 02/28/2018   No Known Allergies       Medication List        Accurate as of 02/28/18 12:20 PM. Always use your most recent med list.          naproxen sodium 220 MG tablet Commonly known as:  ALEVE Take 220 mg by mouth.   oseltamivir 75 MG capsule Commonly known as:  TAMIFLU Take 1 capsule (75 mg total) by mouth 2 (two) times daily.   pantoprazole 40 MG tablet Commonly known as:  PROTONIX TAKE 1 TABLET (40 MG TOTAL) BY MOUTH 2 (TWO) TIMES DAILY.          Objective:    BP 127/81   Pulse (!) 101   Temp (!) 100.4 F (38 C) (Oral)   Ht 6\' 2"  (1.88 m)   Wt 166 lb (75.3 kg)   BMI 21.31 kg/m   Wt Readings from Last 3 Encounters:  02/28/18 166 lb (75.3 kg)  01/14/18 170 lb (77.1 kg)  12/22/17 171 lb (77.6 kg)    Physical Exam  Constitutional: He is oriented to person, place, and time. He appears well-developed and well-nourished.  HENT:  Right Ear: Hearing, tympanic membrane, external ear and ear canal normal.  Left Ear: Hearing, tympanic membrane, external ear and ear canal normal.  Mouth/Throat: Mucous membranes are normal. Posterior oropharyngeal erythema present. No posterior oropharyngeal edema.  Cardiovascular: Normal rate, regular rhythm and normal heart sounds.  Pulmonary/Chest: Effort normal and breath sounds normal. No accessory muscle usage. No respiratory distress. He has  no wheezes. He has no rhonchi. He has no rales.  Lymphadenopathy:    He has no cervical adenopathy.  Neurological: He is alert and oriented to person, place, and time.  Vitals reviewed.   Flu test: negative    Assessment & Plan:   Problem List Items Addressed This Visit    None    Visit Diagnoses    Flu-like symptoms    -  Primary   Relevant Orders   Veritor Flu A/B Waived       Follow up plan: Return if symptoms worsen or fail to improve.  Counseling provided for all of the vaccine components Orders Placed This Encounter  Procedures  . Veritor Flu A/B Waived    Patient seen and examined with Chaney Malling PA student, agree with assessment and plan above. Caryl Pina, MD [facility] 02/28/2018, 12:21 PM

## 2018-03-05 ENCOUNTER — Ambulatory Visit: Payer: Medicare HMO

## 2018-03-06 ENCOUNTER — Ambulatory Visit: Payer: Medicare HMO | Admitting: Physician Assistant

## 2018-03-06 ENCOUNTER — Encounter: Payer: Self-pay | Admitting: Physician Assistant

## 2018-03-06 ENCOUNTER — Ambulatory Visit (INDEPENDENT_AMBULATORY_CARE_PROVIDER_SITE_OTHER): Payer: Medicare HMO | Admitting: Physician Assistant

## 2018-03-06 VITALS — BP 132/86 | HR 71 | Temp 98.7°F | Ht 74.0 in | Wt 164.6 lb

## 2018-03-06 DIAGNOSIS — J40 Bronchitis, not specified as acute or chronic: Secondary | ICD-10-CM | POA: Diagnosis not present

## 2018-03-06 MED ORDER — AZITHROMYCIN 250 MG PO TABS: ORAL_TABLET | ORAL | 0 refills | Status: DC

## 2018-03-06 MED ORDER — PREDNISONE 10 MG (21) PO TBPK: ORAL_TABLET | ORAL | 0 refills | Status: DC

## 2018-03-06 MED ORDER — HYDROCODONE-HOMATROPINE 5-1.5 MG/5ML PO SYRP: 5.0000 mL | ORAL_SOLUTION | Freq: Four times a day (QID) | ORAL | 0 refills | Status: DC | PRN

## 2018-03-06 NOTE — Progress Notes (Signed)
BP 132/86   Pulse 71   Temp 98.7 F (37.1 C) (Oral)   Ht 6\' 2"  (1.88 m)   Wt 164 lb 9.6 oz (74.7 kg)   BMI 21.13 kg/m    Subjective:    Patient ID: Clifford Rowe, male    DOB: 11/27/1986, 32 y.o.   MRN: [id number]  HPI: Clifford Rowe is a 32 y.o. male presenting on 03/06/2018 for Cough  Patient with several days of progressing upper respiratory and bronchial symptoms. Initially there was more upper respiratory congestion. This progressed to having significant cough that is productive throughout the day and severe at night. There is occasional wheezing after coughing. Sometimes there is slight dyspnea on exertion. It is productive mucus that is yellow in color. Denies any blood.  It has gone on for more than a week and he had the flu prior to this.  Past Medical History:  Diagnosis Date  . GERD (gastroesophageal reflux disease)    Relevant past medical, surgical, family and social history reviewed and updated as indicated. Interim medical history since our last visit reviewed. Allergies and medications reviewed and updated. DATA REVIEWED: CHART IN EPIC  Family History reviewed for pertinent findings.  Review of Systems  Constitutional: Positive for fatigue. Negative for appetite change and fever.  HENT: Positive for congestion, sinus pressure and sore throat.   Eyes: Negative.  Negative for pain and visual disturbance.  Respiratory: Positive for cough and wheezing. Negative for chest tightness and shortness of breath.   Cardiovascular: Negative.  Negative for chest pain, palpitations and leg swelling.  Gastrointestinal: Negative.  Negative for abdominal pain, diarrhea, nausea and vomiting.  Endocrine: Negative.   Genitourinary: Negative.   Musculoskeletal: Positive for back pain and myalgias.  Skin: Negative.  Negative for color change and rash.  Neurological: Positive for headaches. Negative for weakness and numbness.  Psychiatric/Behavioral: Negative.     Allergies as of  03/06/2018   No Known Allergies     Medication List        Accurate as of 03/06/18  9:41 AM. Always use your most recent med list.          azithromycin 250 MG tablet Commonly known as:  ZITHROMAX Z-PAK Take as directed   HYDROcodone-homatropine 5-1.5 MG/5ML syrup Commonly known as:  HYCODAN Take 5 mLs by mouth every 6 (six) hours as needed for cough.   pantoprazole 40 MG tablet Commonly known as:  PROTONIX TAKE 1 TABLET (40 MG TOTAL) BY MOUTH 2 (TWO) TIMES DAILY.   predniSONE 10 MG (21) Tbpk tablet Commonly known as:  STERAPRED UNI-PAK 21 TAB As directed x 6 days          Objective:    BP 132/86   Pulse 71   Temp 98.7 F (37.1 C) (Oral)   Ht 6\' 2"  (1.88 m)   Wt 164 lb 9.6 oz (74.7 kg)   BMI 21.13 kg/m   No Known Allergies  Wt Readings from Last 3 Encounters:  03/06/18 164 lb 9.6 oz (74.7 kg)  02/28/18 166 lb (75.3 kg)  01/14/18 170 lb (77.1 kg)    Physical Exam  Constitutional: He appears well-developed and well-nourished.  HENT:  Head: Normocephalic and atraumatic.  Right Ear: Hearing and tympanic membrane normal.  Left Ear: Hearing and tympanic membrane normal.  Nose: Mucosal edema and sinus tenderness present. No nasal deformity. Right sinus exhibits frontal sinus tenderness. Left sinus exhibits frontal sinus tenderness.  Mouth/Throat: Posterior oropharyngeal erythema present.  Eyes: Conjunctivae and  EOM are normal. Pupils are equal, round, and reactive to light. Right eye exhibits no discharge. Left eye exhibits no discharge.  Neck: Normal range of motion. Neck supple.  Cardiovascular: Normal rate, regular rhythm and normal heart sounds.  Pulmonary/Chest: Effort normal. No respiratory distress. He has no decreased breath sounds. He has wheezes. He has no rhonchi. He has no rales.  Abdominal: Soft. Bowel sounds are normal.  Musculoskeletal: Normal range of motion.  Skin: Skin is warm and dry.    Results for orders placed or performed in visit on  02/28/18  Veritor Flu A/B Waived  Result Value Ref Range   Influenza A Negative Negative   Influenza B Negative Negative      Assessment & Plan:   1. Bronchitis - azithromycin (ZITHROMAX Z-PAK) 250 MG tablet; Take as directed  Dispense: 6 each; Refill: 0 - predniSONE (STERAPRED UNI-PAK 21 TAB) 10 MG (21) TBPK tablet; As directed x 6 days  Dispense: 21 tablet; Refill: 0 - HYDROcodone-homatropine (HYCODAN) 5-1.5 MG/5ML syrup; Take 5 mLs by mouth every 6 (six) hours as needed for cough.  Dispense: 120 mL; Refill: 0   Continue all other maintenance medications as listed above.  Follow up plan: No Follow-up on file.  Educational handout given for [place] PA-C [facility] [address]  [place], [place] [address] [phone]   03/06/2018, 9:41 AM

## 2018-05-11 ENCOUNTER — Encounter: Payer: Self-pay | Admitting: Nurse Practitioner

## 2018-05-11 ENCOUNTER — Ambulatory Visit (INDEPENDENT_AMBULATORY_CARE_PROVIDER_SITE_OTHER): Payer: Medicare HMO | Admitting: Nurse Practitioner

## 2018-05-11 VITALS — BP 133/80 | HR 61 | Temp 98.0°F | Ht 74.0 in | Wt 167.0 lb

## 2018-05-11 DIAGNOSIS — S67197A Crushing injury of left little finger, initial encounter: Secondary | ICD-10-CM | POA: Diagnosis not present

## 2018-05-11 DIAGNOSIS — M7701 Medial epicondylitis, right elbow: Secondary | ICD-10-CM | POA: Diagnosis not present

## 2018-05-11 DIAGNOSIS — S6710XA Crushing injury of unspecified finger(s), initial encounter: Secondary | ICD-10-CM

## 2018-05-11 MED ORDER — MELOXICAM 15 MG PO TABS: 15.0000 mg | ORAL_TABLET | Freq: Every day | ORAL | 0 refills | Status: DC

## 2018-05-11 MED ORDER — CEPHALEXIN 500 MG PO CAPS: 500.0000 mg | ORAL_CAPSULE | Freq: Three times a day (TID) | ORAL | 0 refills | Status: DC

## 2018-05-11 NOTE — Patient Instructions (Signed)
Golfer's Elbow Golfer's elbow, also called medial epicondylitis, is a condition that results from inflammation of the strong bands of tissue (tendons) that attach your forearm muscles to the inside of your bone at the elbow. These tendons affect the muscles that bend the palm toward the wrist (flexion). This condition is called golfer's elbow because it is more common among people who constantly bend and twist their wrists, such as golfers. This injury usually results from overuse. Tendons also become less flexible with age. This condition causes elbow pain that may spread to your forearm and upper arm. The pain may get worse when you bend your wrist downward. What are the causes? This condition is an overuse injury that is caused by:  Repeatedly flexing, turning, or twisting your wrist.  Constantly gripping objects with your hands.  What increases the risk? This condition is more likely to develop in people who play golf or tennis or have jobs that require the constant use of their hands. This injury is more common among:  Carpenters.  Gardeners.  Musicians.  Bricklayers.  Typists.  What are the signs or symptoms? Symptoms of this condition include:  Pain near the inner elbow or forearm.  Reduced grip strength.  How is this diagnosed? This condition is diagnosed based on your symptoms, medical history, and physical exam. During the exam, your health care provider may test your grip strength and move your wrist to check for pain. You may also have an MRI to confirm the diagnosis, look for other issues, and check for tears in the ligaments, muscles, or tendons. How is this treated? Treatment for this condition includes:  Stopping all activities that make you bend or twist your wrist until your pain and other symptoms go away.  Icing your wrist to relieve pain.  Taking NSAIDs or getting corticosteroid injections to reduce pain and swelling.  Doing stretches, range-of-motion,  and strengthening exercises (physical therapy) as told by your health care provider.  In rare cases, surgery may be needed if your condition does not improve. Follow these instructions at home:  If directed, apply ice to the injured area. ? Put ice in a plastic bag. ? Place a towel between your skin and the bag. ? Leave the ice on for 20 minutes, 2-3 times a day.  Move your fingers often to avoid stiffness.  Raise (elevate) the injured area above the level of your heart while you are sitting or lying down.  Return to your normal activities as told by your health care provider. Ask your health care provider what activities are safe for you.  Do exercises as told by your health care provider.  Do not use tobacco products, including cigarettes, chewing tobacco, or e-cigarettes. If you need help quitting, ask your health care provider.  Take over-the-counter and prescription medicines only as told by your health care provider.  Keep all follow-up visits as told by your health care provider. This is important. How is this prevented?  Warm up and stretch before being active.  Cool down and stretch after being active.  Give your body time to rest between periods of activity.  Make sure to use equipment that fits you.  Be safe and responsible while being active to avoid falls.  Do at least 150 minutes of moderate-intensity exercise each week, such as brisk walking or water aerobics.  Maintain physical fitness, including: ? Strength. ? Flexibility. ? Cardiovascular fitness. ? Endurance.  Perform exercises to strengthen the forearm muscles.  Slow your golf   swing to reduce shock in the arm when making contact with the ball, if you play golf. Contact a health care provider if:  Your pain does not improve or it gets worse.  You notice numbness in your hand. Get help right away if:  Your pain is severe.  You cannot move your wrist. This information is not intended to replace  advice given to you by your health care provider. Make sure you discuss any questions you have with your health care provider. Document Released: 12/11/2005 Document Revised: 08/15/2016 Document Reviewed: 08/23/2015 Elsevier Interactive Patient Education  [employer].

## 2018-05-11 NOTE — Progress Notes (Signed)
   Subjective:    Patient ID: Clifford Rowe, male    DOB: Oct 03, 1986, 32 y.o.   MRN: [id number]   Chief Complaint: Elbow Pain and possible finger infection   HPI Patient came in today C/o: - pain in right elbow. Started several m onths ago. Worse when lifting things. He has tried muscle relaxer as well as advll and aleve- no help. Rates pain 7/10 right now.  - smashed left 5th finger with a hammer 2 day sago and yesterday it started draining greenish excudate.   Review of Systems  Respiratory: Negative.   Cardiovascular: Negative.   Musculoskeletal: Positive for arthralgias (right elbow).  Skin:       Green pus from left 5th finger  Psychiatric/Behavioral: Negative.   All other systems reviewed and are negative.      Objective:   Physical Exam  Constitutional: He is oriented to person, place, and time. He appears well-developed and well-nourished.  Cardiovascular: Normal rate.  Pulmonary/Chest: Effort normal.  Abdominal: Soft.  Musculoskeletal:  Pain on medial epicondyle with flexion and extension of left elbow-   Neurological: He is alert and oriented to person, place, and time.  Skin: Skin is warm.  Contusion under left 5th nail bed- no extractuion of excudate while in office.   BP 133/80   Pulse 61   Temp 98 F (36.7 C) (Oral)   Ht 6\' 2"  (1.88 m)   Wt 167 lb (75.8 kg)   BMI 21.44 kg/m         Assessment & Plan:  Clifford Rowe in today with chief complaint of Elbow Pain and infected finger   1. Medial epicondylitis of right elbow Tennis elbow strap- wear 24/7 for at least 3-4 weeks Ice area if helps - meloxicam (MOBIC) 15 MG tablet; Take 1 tablet (15 mg total) by mouth daily.  Dispense: 30 tablet; Refill: 0  2. Crushing injury of finger, left Soak in epsom salt 2 x a day Keep clean and dry You will loose nail- will come back on its own - cephALEXin (KEFLEX) 500 MG capsule; Take 1 capsule (500 mg total) by mouth 3 (three) times daily.  Dispense: 30 capsule;  Refill: 0  Clifford Rowe Done, FNP

## 2018-05-23 ENCOUNTER — Other Ambulatory Visit: Payer: Self-pay | Admitting: Family Medicine

## 2018-05-28 ENCOUNTER — Ambulatory Visit (INDEPENDENT_AMBULATORY_CARE_PROVIDER_SITE_OTHER): Payer: Medicare HMO | Admitting: Physician Assistant

## 2018-05-28 ENCOUNTER — Encounter: Payer: Self-pay | Admitting: Physician Assistant

## 2018-05-28 VITALS — BP 127/82 | HR 62 | Temp 97.8°F | Ht 74.0 in | Wt 170.0 lb

## 2018-05-28 DIAGNOSIS — G932 Benign intracranial hypertension: Secondary | ICD-10-CM

## 2018-05-28 DIAGNOSIS — R42 Dizziness and giddiness: Secondary | ICD-10-CM | POA: Diagnosis not present

## 2018-05-28 NOTE — Patient Instructions (Signed)

## 2018-05-28 NOTE — Progress Notes (Signed)
  Subjective:     Patient ID: Clifford Rowe, male   DOB: 01-20-1986, 32 y.o.   MRN: [id number]  HPI Pt with a long hx of headaches with intermit dizziness Has had prev CT scan in Feb of this year - see results Prev sx have been ~ 1 month but sx are now much more freq happening every day Sx will last for hours and spont resolve No meds for sx Denies any recent illness Denies prev hx of head injury FH- Father deceased due to lung&brain CA, mother - hypotension, sister -  HTN, brother - healthy  Review of Systems  Constitutional: Negative.   HENT: Negative.   Eyes: Negative.   Respiratory: Negative.   Cardiovascular: Negative.   Neurological: Positive for dizziness, light-headedness and headaches. Negative for tremors, seizures, syncope, facial asymmetry, speech difficulty, weakness and numbness.       Objective:   Physical Exam  Constitutional: He is oriented to person, place, and time. He appears well-developed and well-nourished. No distress.  HENT:  Right Ear: External ear normal.  Left Ear: External ear normal.  Mouth/Throat: Oropharynx is clear and moist. No oropharyngeal exudate.  Canals and TM's nl bilat  Eyes: Pupils are equal, round, and reactive to light. EOM are normal.  Glasses  Neck: Neck supple.  Cardiovascular: Normal rate, regular rhythm, normal heart sounds and intact distal pulses.  Pulmonary/Chest: Effort normal and breath sounds normal.  Lymphadenopathy:    He has no cervical adenopathy.  Neurological: He is alert and oriented to person, place, and time. He displays normal reflexes. No cranial nerve deficit. Coordination normal.  Skin: He is not diaphoretic.  Nursing note and vitals reviewed.      Assessment:     Vertigo     Plan:     Due to increase in sx and questionable findings on CT scan will refer to Neuro for eval It has been 2 years since labs drawn so CMP ordered today Will inform of lab results F/U pending Neuro referral

## 2018-05-29 LAB — CMP14+EGFR
A/G RATIO: 1.8 (ref 1.2–2.2)
ALBUMIN: 4.6 g/dL (ref 3.5–5.5)
ALK PHOS: 74 IU/L (ref 39–117)
ALT: 18 IU/L (ref 0–44)
AST: 16 IU/L (ref 0–40)
BILIRUBIN TOTAL: 1.1 mg/dL (ref 0.0–1.2)
BUN / CREAT RATIO: 13 (ref 9–20)
BUN: 13 mg/dL (ref 6–20)
CHLORIDE: 99 mmol/L (ref 96–106)
CO2: 25 mmol/L (ref 20–29)
Calcium: 9.8 mg/dL (ref 8.7–10.2)
Creatinine, Ser: 0.98 mg/dL (ref 0.76–1.27)
GFR calc Af Amer: 117 mL/min/{1.73_m2} (ref >59)
GFR calc non Af Amer: 101 mL/min/{1.73_m2} (ref >59)
GLOBULIN, TOTAL: 2.5 g/dL (ref 1.5–4.5)
Glucose: 107 mg/dL — ABNORMAL HIGH (ref 65–99)
Potassium: 4.1 mmol/L (ref 3.5–5.2)
SODIUM: 140 mmol/L (ref 134–144)
Total Protein: 7.1 g/dL (ref 6.0–8.5)

## 2018-06-03 ENCOUNTER — Ambulatory Visit (INDEPENDENT_AMBULATORY_CARE_PROVIDER_SITE_OTHER): Payer: Medicare HMO | Admitting: Family Medicine

## 2018-06-03 ENCOUNTER — Encounter: Payer: Self-pay | Admitting: Family Medicine

## 2018-06-03 VITALS — BP 129/79 | HR 91 | Temp 97.9°F | Ht 74.0 in | Wt 162.0 lb

## 2018-06-03 DIAGNOSIS — G43001 Migraine without aura, not intractable, with status migrainosus: Secondary | ICD-10-CM | POA: Diagnosis not present

## 2018-06-03 MED ORDER — PROMETHAZINE HCL 25 MG/ML IJ SOLN
12.5000 mg | Freq: Once | INTRAMUSCULAR | Status: AC
  Administered 2018-06-03: 12.5 mg via INTRAMUSCULAR

## 2018-06-03 MED ORDER — METHYLPREDNISOLONE ACETATE 80 MG/ML IJ SUSP
80.0000 mg | Freq: Once | INTRAMUSCULAR | Status: AC
  Administered 2018-06-03: 80 mg via INTRAMUSCULAR

## 2018-06-03 NOTE — Progress Notes (Signed)
Subjective: CC: headache PCP: Timmothy Euler, MD Clifford Rowe is a 32 y.o. male presenting to clinic today for:  1. Headache Patient reports a history of chronic headaches that are intermittent.  He describes the headaches as severe right-sided and occurring anywhere between 0 and 3 times per month.  Today, the pain is 4/10.  This is actually improved from earlier when the headache started.  He reports associated nausea, vomiting, photophobia and phonophobia.  He actually has an appointment with neurology tomorrow for evaluation.  He had imaging of his brain which demonstrated some signs and symptoms concerning for pseudotumor cerebri.  He notes that last time he had an injection here for headaches that did not help very much.  He is used over-the-counter Advil and Aleve with little improvement in symptoms.  Denies any slurred speech, confusion, visual disturbance, gait abnormalities, weakness or numbness and tingling.   ROS: Per HPI  No Known Allergies Past Medical History:  Diagnosis Date  . GERD (gastroesophageal reflux disease)     Current Outpatient Medications:  .  pantoprazole (PROTONIX) 40 MG tablet, TAKE 1 TABLET BY MOUTH TWICE A DAY, Disp: 180 tablet, Rfl: 0 Social History   Socioeconomic History  . Marital status: Single    Spouse name: Not on file  . Number of children: Not on file  . Years of education: Not on file  . Highest education level: Not on file  Occupational History  . Occupation: unemployed  Social Needs  . Financial resource strain: Not on file  . Food insecurity:    Worry: Not on file    Inability: Not on file  . Transportation needs:    Medical: Not on file    Non-medical: Not on file  Tobacco Use  . Smoking status: Never Smoker  . Smokeless tobacco: Never Used  Substance and Sexual Activity  . Alcohol use: No  . Drug use: No  . Sexual activity: Yes  Lifestyle  . Physical activity:    Days per week: Not on file    Minutes per  session: Not on file  . Stress: Not on file  Relationships  . Social connections:    Talks on phone: Not on file    Gets together: Not on file    Attends religious service: Not on file    Active member of club or organization: Not on file    Attends meetings of clubs or organizations: Not on file    Relationship status: Not on file  . Intimate partner violence:    Fear of current or ex partner: Not on file    Emotionally abused: Not on file    Physically abused: Not on file    Forced sexual activity: Not on file  Other Topics Concern  . Not on file  Social History Narrative  . Not on file   Family History  Problem Relation Age of Onset  . Cancer Father        lung    Objective: Office vital signs reviewed. BP 129/79   Pulse 91   Temp 97.9 F (36.6 C) (Oral)   Ht 6\' 2"  (1.88 m)   Wt 162 lb (73.5 kg)   BMI 20.80 kg/m   Physical Examination:  General: Awake, alert, well nourished, No acute distress HEENT: Normal    Neck: No masses palpated. No lymphadenopathy    Eyes: PERRLA, extraocular membranes intact, sclera white    Nose: nasal turbinates moist, no nasal discharge    Throat:  moist mucus membranes, no erythema, no tonsillar exudate.  Airway is patent Extremities: warm, well perfused, No edema, cyanosis or clubbing; +2 pulses bilaterally MSK: normal gait and normal station Skin: dry; intact; no rashes or lesions Neuro: 5/5 UE and LE Strength and light touch sensation grossly intact, patellar DTRs 1/4; cranial nerves II through XII grossly intact.  Alert and oriented x3.  Assessment/ Plan: 32 y.o. male   1. Migraine without aura and with status migrainosus, not intractable No red flags.  Headache possibly related to suspected pseudotumor cerebri.  Since his symptoms are refractory to NSAIDs, he was given a dose of Depo-Medrol 80 and promethazine here in office.  Home care instructions were reviewed with the patient.  Reasons for emergent evaluation the emergency  department discussed.  He will see neurology as scheduled tomorrow.  Follow-up with PCP as needed. - methylPREDNISolone acetate (DEPO-MEDROL) injection 80 mg - promethazine (PHENERGAN) injection 12.5 mg  Meds ordered this encounter  Medications  . methylPREDNISolone acetate (DEPO-MEDROL) injection 80 mg  . promethazine (PHENERGAN) injection 12.5 mg     Janora Norlander, DO [place] [phone]

## 2018-06-03 NOTE — Patient Instructions (Signed)
You are given 2 different injections to help with your headache today, Depo-Medrol 80 mg (steroid with anti-inflammatory properties) and promethazine 12.5 mg (helps with migraine and nausea).  Keep your appointment with neurologist.

## 2018-06-03 NOTE — Progress Notes (Signed)
qa 

## 2018-06-04 DIAGNOSIS — G43709 Chronic migraine without aura, not intractable, without status migrainosus: Secondary | ICD-10-CM | POA: Diagnosis not present

## 2018-06-04 DIAGNOSIS — G43109 Migraine with aura, not intractable, without status migrainosus: Secondary | ICD-10-CM | POA: Diagnosis not present

## 2018-06-04 DIAGNOSIS — R42 Dizziness and giddiness: Secondary | ICD-10-CM | POA: Diagnosis not present

## 2018-06-07 ENCOUNTER — Other Ambulatory Visit: Payer: Self-pay | Admitting: Nurse Practitioner

## 2018-06-07 DIAGNOSIS — R0981 Nasal congestion: Secondary | ICD-10-CM | POA: Diagnosis not present

## 2018-06-07 DIAGNOSIS — M7701 Medial epicondylitis, right elbow: Secondary | ICD-10-CM

## 2018-06-07 DIAGNOSIS — J329 Chronic sinusitis, unspecified: Secondary | ICD-10-CM | POA: Diagnosis not present

## 2018-06-25 DIAGNOSIS — J309 Allergic rhinitis, unspecified: Secondary | ICD-10-CM | POA: Diagnosis not present

## 2018-07-07 ENCOUNTER — Other Ambulatory Visit: Payer: Self-pay | Admitting: Family Medicine

## 2018-07-07 DIAGNOSIS — M7701 Medial epicondylitis, right elbow: Secondary | ICD-10-CM

## 2018-07-22 DIAGNOSIS — R42 Dizziness and giddiness: Secondary | ICD-10-CM | POA: Diagnosis not present

## 2018-07-22 DIAGNOSIS — R5383 Other fatigue: Secondary | ICD-10-CM | POA: Diagnosis not present

## 2018-07-23 DIAGNOSIS — R42 Dizziness and giddiness: Secondary | ICD-10-CM | POA: Diagnosis not present

## 2018-08-16 ENCOUNTER — Other Ambulatory Visit: Payer: Self-pay | Admitting: *Deleted

## 2018-08-16 DIAGNOSIS — M7701 Medial epicondylitis, right elbow: Secondary | ICD-10-CM

## 2018-08-16 MED ORDER — MELOXICAM 15 MG PO TABS: 15.0000 mg | ORAL_TABLET | Freq: Every day | ORAL | 0 refills | Status: AC

## 2018-08-23 ENCOUNTER — Other Ambulatory Visit: Payer: Self-pay | Admitting: *Deleted

## 2018-08-23 MED ORDER — PANTOPRAZOLE SODIUM 40 MG PO TBEC: DELAYED_RELEASE_TABLET | ORAL | 0 refills | Status: AC

## 2018-09-02 DIAGNOSIS — J341 Cyst and mucocele of nose and nasal sinus: Secondary | ICD-10-CM | POA: Diagnosis not present

## 2018-09-02 DIAGNOSIS — J309 Allergic rhinitis, unspecified: Secondary | ICD-10-CM | POA: Diagnosis not present

## 2018-09-02 DIAGNOSIS — R51 Headache: Secondary | ICD-10-CM | POA: Diagnosis not present

## 2018-09-02 DIAGNOSIS — J328 Other chronic sinusitis: Secondary | ICD-10-CM | POA: Diagnosis not present

## 2018-09-24 ENCOUNTER — Other Ambulatory Visit: Payer: Self-pay | Admitting: Family Medicine

## 2018-09-24 NOTE — Telephone Encounter (Signed)
Attempted to contact patient - NA °

## 2018-09-24 NOTE — Telephone Encounter (Signed)
Former Multimedia [employer].NTBS.30 day given 08/23/18

## 2018-10-17 DIAGNOSIS — M79671 Pain in right foot: Secondary | ICD-10-CM | POA: Diagnosis not present

## 2018-10-17 DIAGNOSIS — M722 Plantar fascial fibromatosis: Secondary | ICD-10-CM | POA: Diagnosis not present

## 2018-10-17 DIAGNOSIS — M79672 Pain in left foot: Secondary | ICD-10-CM | POA: Diagnosis not present

## 2018-10-17 DIAGNOSIS — R110 Nausea: Secondary | ICD-10-CM | POA: Diagnosis not present

## 2019-01-17 IMAGING — CT CT HEAD W/O CM
3 of 4 series · 15 of 47 positions shown, 18 images · non-contrast
Comparison: Report of brain MRI 10/29/2011 (no images available).

CLINICAL DATA: 31-year-old male with severe headache, dizziness and
weakness. Worst headache of life.

EXAM:
CT HEAD WITHOUT CONTRAST
TECHNIQUE: Contiguous axial images were obtained from the base of the skull
through the vertex without intravenous contrast.

[Series 2: head wo · axial · 0.47mm/px · z∈[+62,+202]mm · 9 of 34 slices shown, 12 images]
[im 3/34  brain]
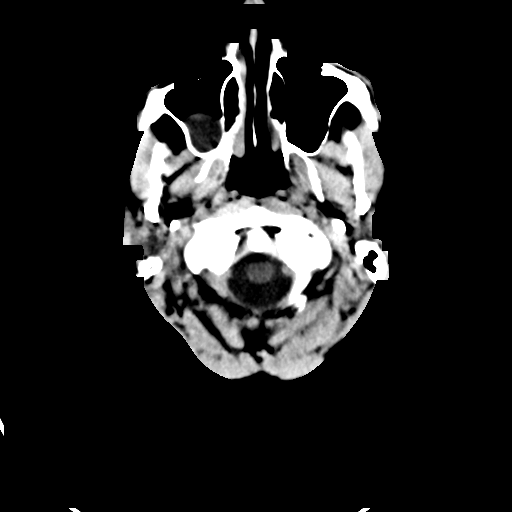
[im 3/34  bone]
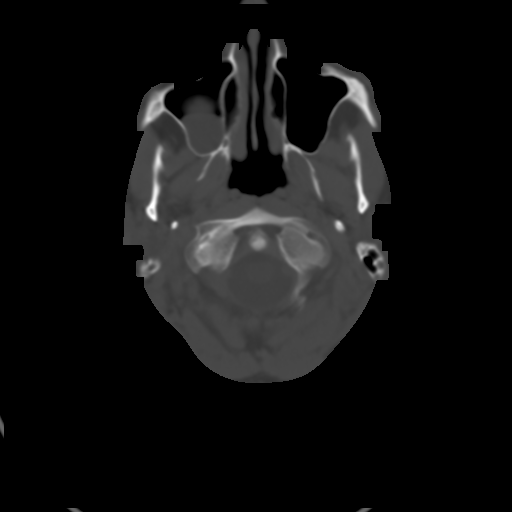
[im 8/34  brain]
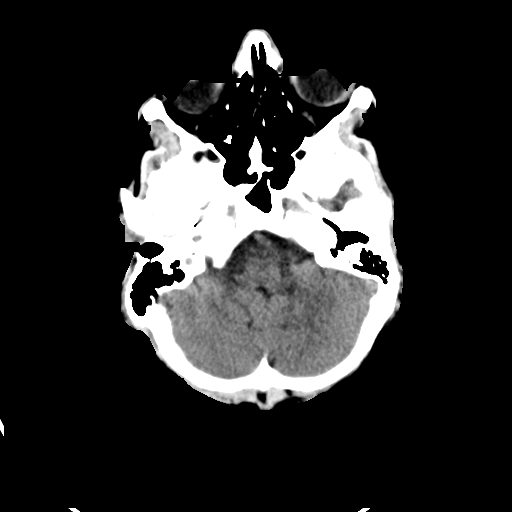
[im 10/34  brain]
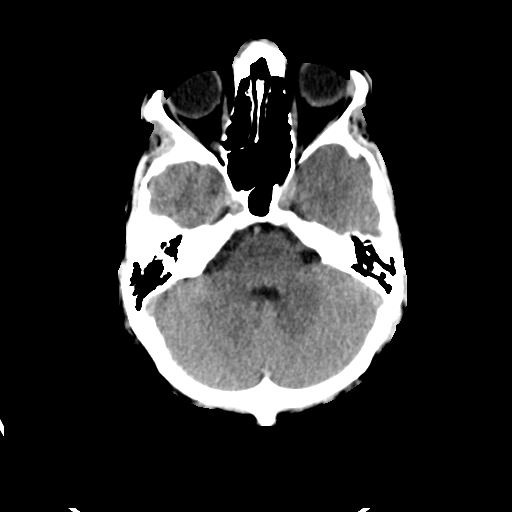
[im 15/34  brain]
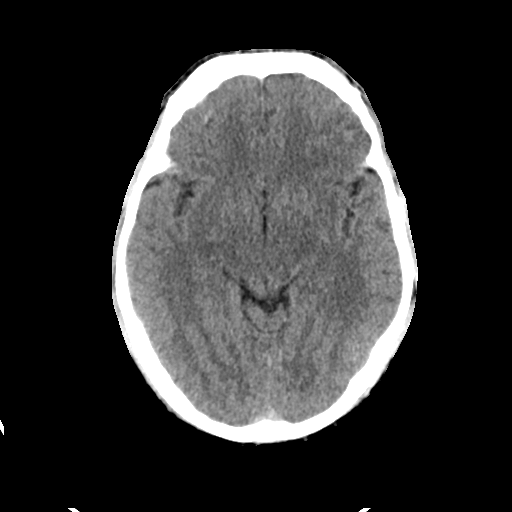
[im 17/34  brain]
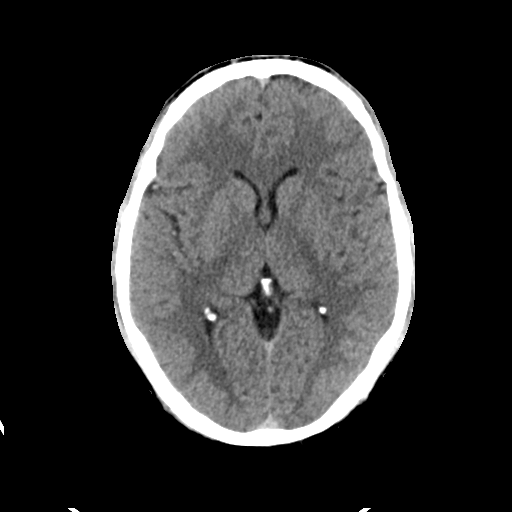
[im 17/34  bone]
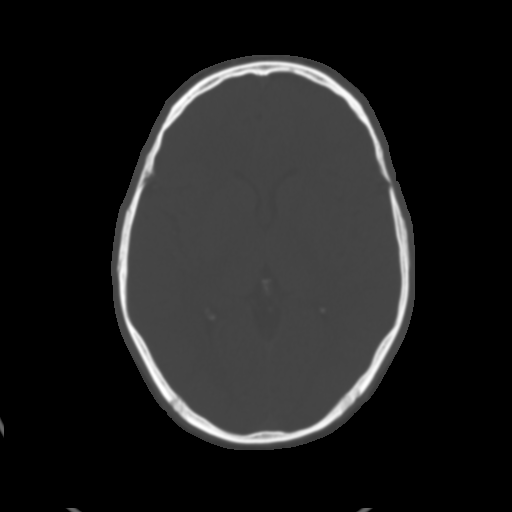
[im 19/34  brain]
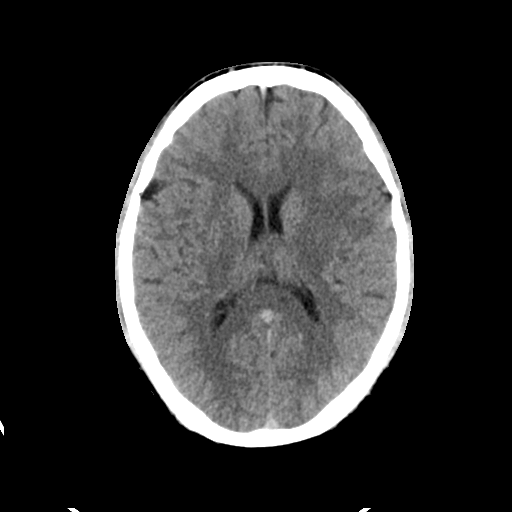
[im 24/34  brain]
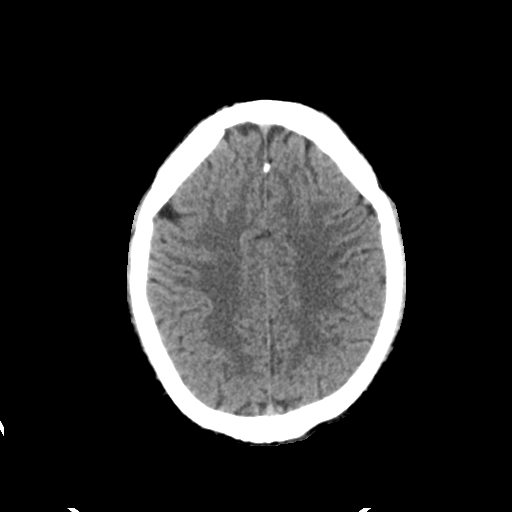
[im 26/34  brain]
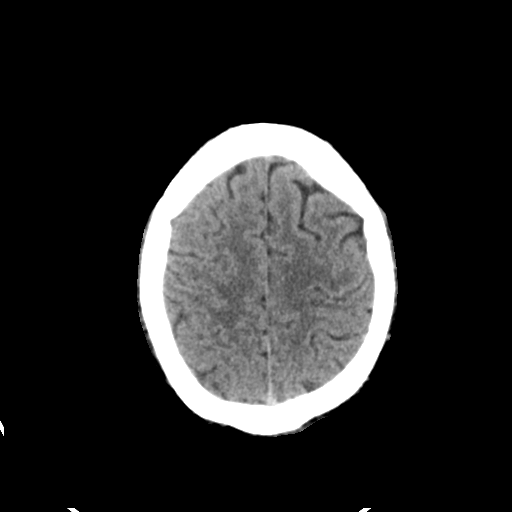
[im 31/34  brain]
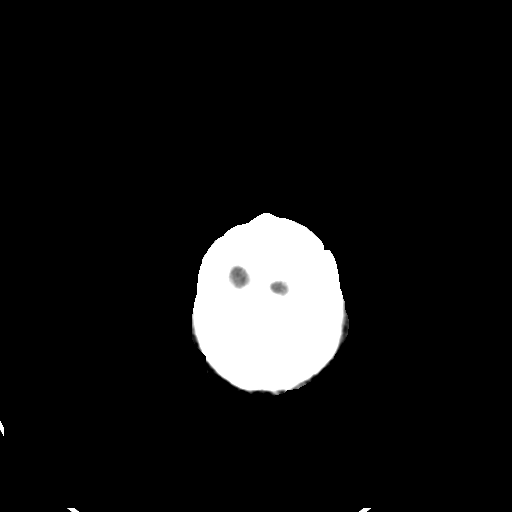
[im 31/34  bone]
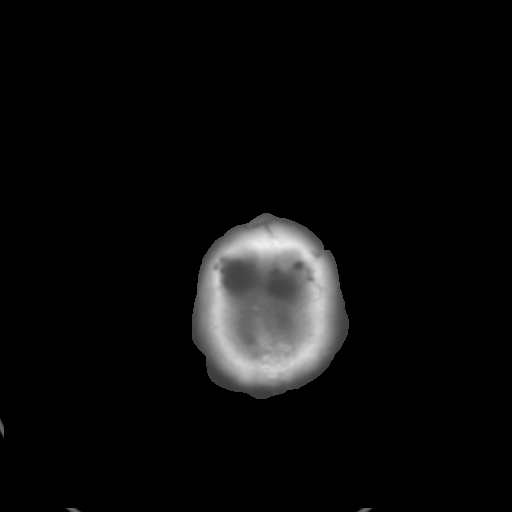

[Series 4: coronal soft tissue · coronal · 0.34mm/px · 3 of 72 slices shown]
[im 24/72  brain]
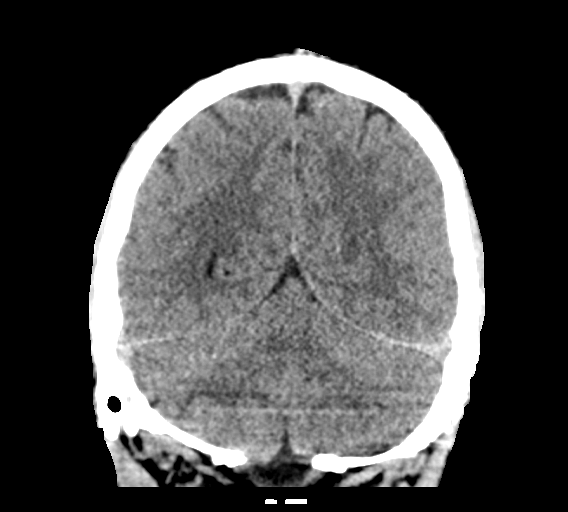
[im 32/72  brain]
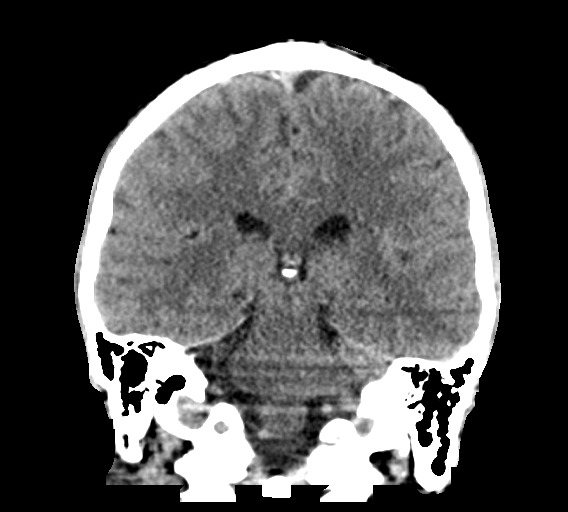
[im 40/72  brain]
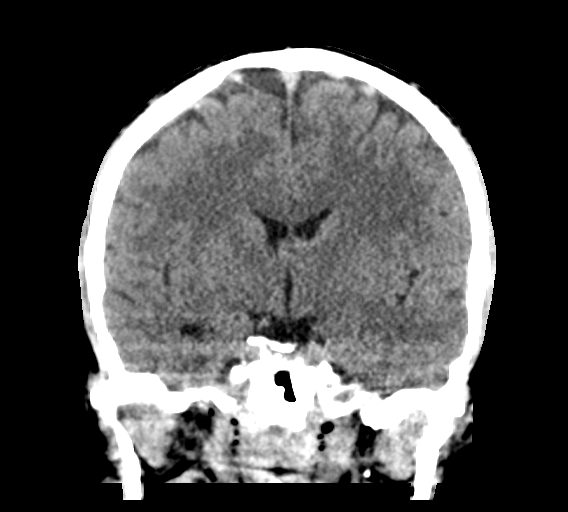

[Series 5: sagittal soft tissue · sagittal · 0.39mm/px · 3 of 59 slices shown]
[im 20/59  brain]
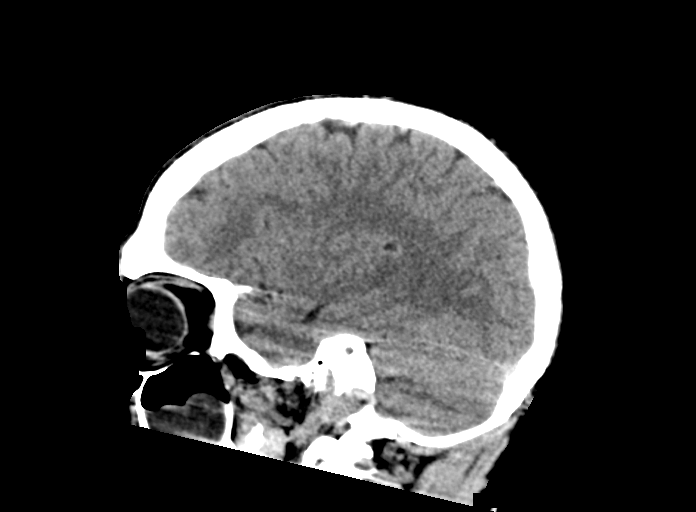
[im 30/59  brain]
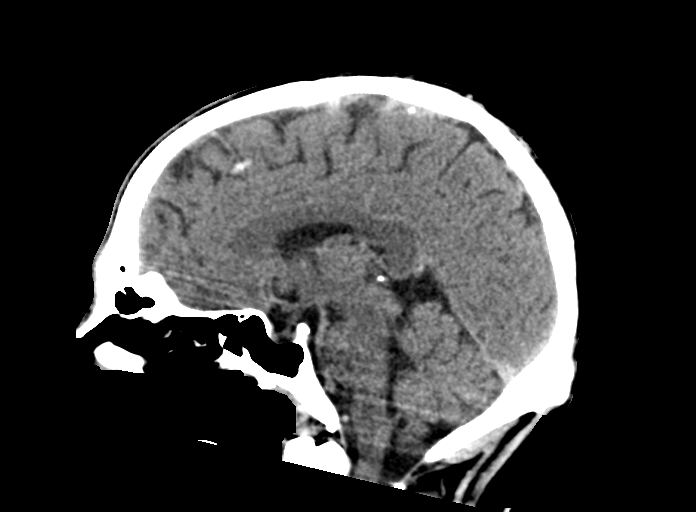
[im 39/59  brain]
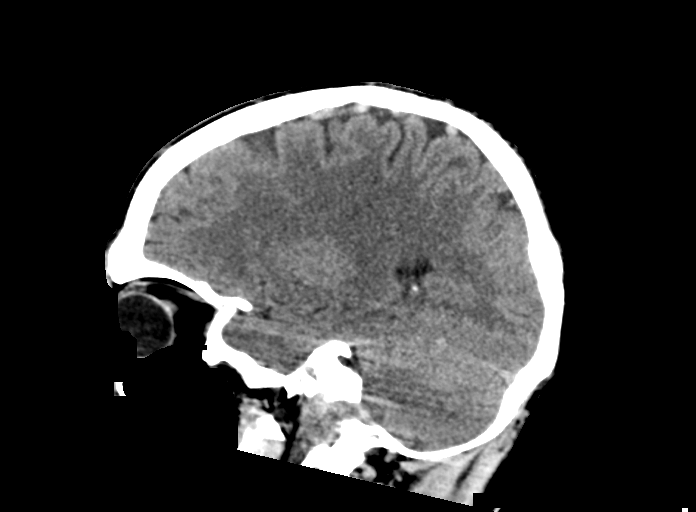

[15 of 47 positions shown; findings below may reference images not displayed]

FINDINGS: Brain: Cerebral volume is within normal limits. No midline shift,
ventriculomegaly, mass effect, evidence of mass lesion, intracranial
hemorrhage or evidence of cortically based acute infarction.
Gray-white matter differentiation is within normal limits throughout
the brain. Partially empty sella (coronal image 31).

Vascular: No suspicious intracranial vascular hyperdensity.

Skull: Negative.

Sinuses/Orbits: Moderate opacification of the right maxillary sinus
which appears related to 1 or more mucous retention cyst. Partially
visible mucous retention cysts in the inferior aspect of the visible
left maxillary sinus. The remaining paranasal sinuses are clear.
Bilateral tympanic cavities and mastoids are clear.

Other: Visualized orbits and scalp soft tissues are within normal
limits.
IMPRESSION: 1. Normal noncontrast CT appearance of the brain aside from
partially empty sella, which is often a normal anatomic variant but
can be associated with idiopathic intracranial hypertension
(pseudotumor cerebri).
2. Bilateral maxillary sinus mucous retention cysts, significance
doubtful.

## 2019-01-21 DIAGNOSIS — M7701 Medial epicondylitis, right elbow: Secondary | ICD-10-CM | POA: Diagnosis not present

## 2019-01-21 DIAGNOSIS — R110 Nausea: Secondary | ICD-10-CM | POA: Diagnosis not present

## 2019-01-21 DIAGNOSIS — G43909 Migraine, unspecified, not intractable, without status migrainosus: Secondary | ICD-10-CM | POA: Diagnosis not present

## 2019-05-23 DIAGNOSIS — L237 Allergic contact dermatitis due to plants, except food: Secondary | ICD-10-CM | POA: Diagnosis not present

## 2019-06-18 DIAGNOSIS — R5383 Other fatigue: Secondary | ICD-10-CM | POA: Diagnosis not present

## 2019-06-18 DIAGNOSIS — G43909 Migraine, unspecified, not intractable, without status migrainosus: Secondary | ICD-10-CM | POA: Diagnosis not present

## 2019-06-18 DIAGNOSIS — K219 Gastro-esophageal reflux disease without esophagitis: Secondary | ICD-10-CM | POA: Diagnosis not present

## 2019-06-18 DIAGNOSIS — R42 Dizziness and giddiness: Secondary | ICD-10-CM | POA: Diagnosis not present

## 2019-08-13 DIAGNOSIS — Y939 Activity, unspecified: Secondary | ICD-10-CM | POA: Diagnosis not present

## 2019-08-13 DIAGNOSIS — X58XXXA Exposure to other specified factors, initial encounter: Secondary | ICD-10-CM | POA: Diagnosis not present

## 2019-08-13 DIAGNOSIS — S39012A Strain of muscle, fascia and tendon of lower back, initial encounter: Secondary | ICD-10-CM | POA: Diagnosis not present

## 2024-10-27 ENCOUNTER — Ambulatory Visit: Admitting: Physical Therapy

## 2024-10-28 ENCOUNTER — Encounter: Payer: Self-pay | Admitting: Physical Therapy

## 2024-10-28 ENCOUNTER — Ambulatory Visit: Attending: Physician Assistant | Admitting: Physical Therapy

## 2024-10-28 ENCOUNTER — Other Ambulatory Visit: Payer: Self-pay

## 2024-10-28 DIAGNOSIS — M25521 Pain in right elbow: Secondary | ICD-10-CM | POA: Insufficient documentation

## 2024-10-28 NOTE — Therapy (Signed)
 OUTPATIENT PHYSICAL THERAPY UPPER EXTREMITY EVALUATION   Patient Name: Clifford Rowe MRN: [id number] DOB:28-Aug-1986, 38 y.o., male Today's Date: 10/28/2024  END OF SESSION:  PT End of Session - 10/28/24 1151     Visit Number 1    Number of Visits 6    Date for Recertification  12/09/24    PT Start Time 1015    PT Stop Time 1059    PT Time Calculation (min) 44 min    Activity Tolerance Patient tolerated treatment well    Behavior During Therapy [facility] for tasks assessed/performed          Past Medical History:  Diagnosis Date   GERD (gastroesophageal reflux disease)    Past Surgical History:  Procedure Laterality Date   CYSTECTOMY     SURGERY SCROTAL / TESTICULAR     Patient Active Problem List   Diagnosis Date Noted   Melanocytic nevi of trunk 01/29/2014   GERD (gastroesophageal reflux disease) 05/28/2013   History of undescended testicle 01/23/2012   Varicocele 01/23/2012    REFERRING PROVIDER: Delon Opal PA-C  REFERRING DIAG: Medial epicondylitis of right elbow.    THERAPY DIAG:  Pain in right elbow - Plan: PT plan of care cert/re-cert  Rationale for Evaluation and Treatment: Rehabilitation  ONSET DATE: Ongoing.    SUBJECTIVE:                                                                                                                                                                                      SUBJECTIVE STATEMENT: The patient presents to the clinic with c/o right elbow pain that has been ongoing for many years.  He states a recent injection was very helpful and his pain is a low 1-2/10.  Pain can increase when using tools.    PERTINENT HISTORY: See above.    PAIN:  Are you having pain? Yes: NPRS scale: 1-2/10.   Pain location: Right medial elbow.   Pain description: Ache. Aggravating factors: Using tools.   Relieving factors: Rest, injection.    PRECAUTIONS: None  RED FLAGS: None   WEIGHT BEARING RESTRICTIONS: No  FALLS:  Has  patient fallen in last 6 months? No  LIVING ENVIRONMENT: Lives in: House/apartment Has following equipment at home: None  PLOF: Independent  PATIENT GOALS: Not have pain when using right hand.   OBJECTIVE:   PATIENT SURVEYS :  QUICKDASH:  13 points (4.55).     UPPER EXTREMITY ROM:  Normal right wrist, elbow and forearm AROM.  UPPER EXTREMITY MMT:  Normal right wrist strength.  He states he is left hand dominant and gave a grip output of 100# on the left and  80# on the right.    PALPATION:  Mild tenderness over right medial epicondyle.                                                                                                                               TREATMENT DATE: HMP and pre-mod e'stim (low-level) over right medial epicondyle region x 20 minutes.  Normal modality resposne following removal of modality.   PATIENT EDUCATION: Education details: Provided patient with handout on obtaining a TENS unit and short duration ice massage.  Person educated: Patient Education method: Explanation Education comprehension: verbalized understanding  HOME EXERCISE PROGRAM:   ASSESSMENT:  CLINICAL IMPRESSION: The patient presents to [facility] with chronic right medial elbow pain.  He reports a recent injection was very helpful.  He has mild palpable tenderness over his right medial epicondyle.  His range of motion is full.  His QUICKDASH score is 13 points (4.5%). He was instructed in how to obtain a TENS unit.  He plans to wait before scheduling another appointment to see is his pain stays low.    OBJECTIVE IMPAIRMENTS: decreased activity tolerance and pain.   ACTIVITY LIMITATIONS: carrying and lifting  PARTICIPATION LIMITATIONS: occupation.  Tool usage (ie:  wrenches).    PERSONAL FACTORS: Time since onset of injury/illness/exacerbation are also affecting patient's functional outcome.   REHAB POTENTIAL: Excellent  CLINICAL DECISION MAKING:  Stable/uncomplicated  EVALUATION COMPLEXITY: Low  GOALS:  SHORT TERM GOALS: Target date: 11/25/24:  Ind with an HEP. Goal status: INITIAL  2.  Perform ADL's with right elbow pain not > 2/10.  Goal status: INITIAL  PLAN: PT FREQUENCY/DURATION:  4-6 visits.   PLANNED INTERVENTIONS: 97110-Therapeutic exercises, 97530- Therapeutic activity, [id number]- Neuromuscular re-education, 97535- Self Care, [id number]- Manual therapy, [id number]- Electrical stimulation (unattended), 97035- Ultrasound, Patient/Family education, Cryotherapy, and Moist heat  PLAN FOR NEXT SESSION: Pulsed US , STW/M, theraband resisted right wrist and elbow exercises.     Alvar Malinoski, PT 10/28/2024, 12:14 PM

## 2025-02-13 ENCOUNTER — Ambulatory Visit: Admitting: Cardiology
